# Patient Record
Sex: Female | Born: 1970 | Race: Black or African American | Hispanic: No | Marital: Married | Smoking: Never smoker
Health system: Southern US, Community
[De-identification: ages and names within clinical notes are randomized; demographics above are authoritative.]

## PROBLEM LIST (undated history)

## (undated) DIAGNOSIS — R7303 Prediabetes: Secondary | ICD-10-CM

## (undated) DIAGNOSIS — R0602 Shortness of breath: Secondary | ICD-10-CM

## (undated) DIAGNOSIS — D649 Anemia, unspecified: Secondary | ICD-10-CM

## (undated) DIAGNOSIS — E7800 Pure hypercholesterolemia, unspecified: Secondary | ICD-10-CM

## (undated) DIAGNOSIS — I10 Essential (primary) hypertension: Secondary | ICD-10-CM

## (undated) HISTORY — DX: Shortness of breath: R06.02

## (undated) HISTORY — DX: Prediabetes: R73.03

## (undated) HISTORY — DX: Essential (primary) hypertension: I10

## (undated) HISTORY — DX: Anemia, unspecified: D64.9

## (undated) HISTORY — DX: Pure hypercholesterolemia, unspecified: E78.00

---

## 2008-08-12 ENCOUNTER — Ambulatory Visit: Payer: Self-pay

## 2010-06-22 ENCOUNTER — Ambulatory Visit: Payer: Self-pay

## 2012-05-29 ENCOUNTER — Ambulatory Visit: Payer: Self-pay

## 2012-10-31 HISTORY — PX: REDUCTION MAMMAPLASTY: SUR839

## 2013-07-24 ENCOUNTER — Ambulatory Visit: Payer: Self-pay | Admitting: Family Medicine

## 2014-08-04 ENCOUNTER — Ambulatory Visit: Payer: Self-pay | Admitting: Family Medicine

## 2015-10-12 ENCOUNTER — Other Ambulatory Visit: Payer: Self-pay | Admitting: Family Medicine

## 2015-10-12 DIAGNOSIS — Z1231 Encounter for screening mammogram for malignant neoplasm of breast: Secondary | ICD-10-CM

## 2015-10-21 ENCOUNTER — Ambulatory Visit
Admission: RE | Admit: 2015-10-21 | Discharge: 2015-10-21 | Disposition: A | Discharge status: Home / Self Care | Payer: 59 | Source: Ambulatory Visit | Attending: Family Medicine | Admitting: Family Medicine

## 2015-10-21 DIAGNOSIS — Z1231 Encounter for screening mammogram for malignant neoplasm of breast: Secondary | ICD-10-CM | POA: Diagnosis present

## 2016-06-10 ENCOUNTER — Encounter: Payer: Self-pay | Admitting: Internal Medicine

## 2016-06-10 ENCOUNTER — Encounter (INDEPENDENT_AMBULATORY_CARE_PROVIDER_SITE_OTHER): Payer: Self-pay

## 2016-06-10 ENCOUNTER — Ambulatory Visit: Payer: Self-pay

## 2016-06-10 ENCOUNTER — Inpatient Hospital Stay: Payer: 59 | Attending: Internal Medicine | Admitting: Internal Medicine

## 2016-06-10 DIAGNOSIS — D509 Iron deficiency anemia, unspecified: Secondary | ICD-10-CM | POA: Insufficient documentation

## 2016-06-10 DIAGNOSIS — Z79899 Other long term (current) drug therapy: Secondary | ICD-10-CM | POA: Insufficient documentation

## 2016-06-10 DIAGNOSIS — D500 Iron deficiency anemia secondary to blood loss (chronic): Secondary | ICD-10-CM | POA: Insufficient documentation

## 2016-06-10 DIAGNOSIS — Z975 Presence of (intrauterine) contraceptive device: Secondary | ICD-10-CM | POA: Diagnosis not present

## 2016-06-10 DIAGNOSIS — R5383 Other fatigue: Secondary | ICD-10-CM | POA: Diagnosis not present

## 2016-06-10 DIAGNOSIS — D508 Other iron deficiency anemias: Secondary | ICD-10-CM

## 2016-06-10 DIAGNOSIS — N920 Excessive and frequent menstruation with regular cycle: Secondary | ICD-10-CM | POA: Diagnosis not present

## 2016-06-10 NOTE — Progress Notes (Signed)
[facility] CONSULT NOTE  Patient Care Team: Marisue Ivan, MD as PCP - General (Family Medicine)  CHIEF COMPLAINTS/PURPOSE OF CONSULTATION:    # AUG 2017 IDA- [intol to Iron] Hb-9.2/Ferritin 7 [chronic- PCP office]   No history exists.     HISTORY OF PRESENTING ILLNESS:  Candace Frank 45 y.o.  female with long-standing history of anemia- has been referred was for further evaluation.  Patient complains of significant fatigue; no shortness of breath or chest pain. No swelling in the legs. She denies any blood in stools or black stools. Oral iron causes diarrhea. She does not have significant heavy menstrual periods. She just has 1 day of menstrual periods which is heavy- which is likely from IUD. Never had any prior blood transfusions. No colonoscopy. No rigidity. No weight loss. No family history of GI malignancy.  ROS: A complete 10 point review of system is done which is negative except mentioned above in history of present illness  MEDICAL HISTORY:  Past Medical History:  Diagnosis Date  . Anemia     SURGICAL HISTORY: Past Surgical History:  Procedure Laterality Date  . REDUCTION MAMMAPLASTY      SOCIAL HISTORY: [facility];  No smoking/ no alcohol/ [place]..  Social History   Social History  . Marital status: Married    Spouse name: N/A  . Number of children: N/A  . Years of education: N/A   Occupational History  . Not on file.   Social History Main Topics  . Smoking status: Not on file  . Smokeless tobacco: Not on file  . Alcohol use Not on file  . Drug use: Unknown  . Sexual activity: Not on file   Other Topics Concern  . Not on file   Social History Narrative  . No narrative on file    FAMILY HISTORY: Family History  Problem Relation Age of Onset  . Breast cancer Mother 40    ALLERGIES:  has no allergies on file.  MEDICATIONS:  Current Outpatient Prescriptions  Medication Sig Dispense Refill  . ferrous sulfate 325  (65 FE) MG tablet Take by mouth.    . hydrochlorothiazide (HYDRODIURIL) 12.5 MG tablet Take by mouth.     No current facility-administered medications for this visit.       Marland Kitchen  PHYSICAL EXAMINATION: ECOG PERFORMANCE STATUS: 0 - Asymptomatic  Vitals:   06/10/16 1517  BP: (!) 155/94  Pulse: (!) 109  Resp: 18  Temp: 99 F (37.2 C)   Filed Weights   06/10/16 1517  Weight: 159 lb 2 oz (72.2 kg)    GENERAL: Well-nourished well-developed; Alert, no distress and comfortable.  alone EYES: pallor OROPHARYNX: no thrush or ulceration; good dentition  NECK: supple, no masses felt LYMPH:  no palpable lymphadenopathy in the cervical, axillary or inguinal regions LUNGS: clear to auscultation and  No wheeze or crackles HEART/CVS: regular rate & rhythm and no murmurs; No lower extremity edema ABDOMEN: abdomen soft, non-tender and normal bowel sounds Musculoskeletal:no cyanosis of digits and no clubbing  PSYCH: alert & oriented x 3 with fluent speech NEURO: no focal motor/sensory deficits SKIN:  no rashes or significant lesions  LABORATORY DATA:  I have reviewed the data as listed No results found for: WBC, HGB, HCT, MCV, PLT No results for input(s): NA, K, CL, CO2, GLUCOSE, BUN, CREATININE, CALCIUM, GFRNONAA, GFRAA, PROT, ALBUMIN, AST, ALT, ALKPHOS, BILITOT, BILIDIR, IBILI in the last 8760 hours.  RADIOGRAPHIC STUDIES: I have personally reviewed the radiological images as listed  and agreed with the findings in the report. No results found.  ASSESSMENT & PLAN:   Other iron deficiency anemias Iron deficiency anemia symptomatic ferritin 7/hemoglobin 9.5. The etiology is unclear. Question history of menstrual periods versus others. Recommend urinalysis and stool occult. Discussed the possibile need for endoscopy EGD and colonoscopy.  # Recommend IV Venofer weekly 4  ; start next week. Discussed potential acute reactions.   # Check CBC LDH-2 months.   # Follow with me in 3 months/  CBC and iron studies/ferritin.   Thank you Dr. Burnadette PopLinthavong for allowing me to participate in the care of your pleasant patient. Please do not hesitate to contact me with questions or concerns in the interim.  All questions were answered. The patient knows to call the clinic with any problems, questions or concerns.  # 30 minutes face-to-face with the patient discussing the above plan of care; more than 50% of time spent on counseling and coordination.     Earna CoderGovinda R Tyshawn Keel, MD 06/10/2016 7:00 PM

## 2016-06-10 NOTE — Assessment & Plan Note (Signed)
Iron deficiency anemia symptomatic ferritin 7/hemoglobin 9.5. The etiology is unclear. Question history of menstrual periods versus others. Recommend urinalysis and stool occult. Discussed the possibile need for endoscopy EGD and colonoscopy.  # Recommend IV Venofer weekly 4 ; start next week. Discussed potential acute reactions.   # Check CBC LDH-2 months.   # Follow with me in 3 months/ CBC and iron studies/ferritin.

## 2016-06-10 NOTE — Progress Notes (Signed)
Patient here as new evaluation regarding anemia.  Referred by Dr. Burnadette PopLinthavong.

## 2016-06-17 ENCOUNTER — Inpatient Hospital Stay: Payer: 59

## 2016-06-17 ENCOUNTER — Other Ambulatory Visit: Payer: Self-pay | Admitting: Internal Medicine

## 2016-06-17 VITALS — BP 159/96 | HR 75 | Temp 98.7°F | Resp 18

## 2016-06-17 DIAGNOSIS — D508 Other iron deficiency anemias: Secondary | ICD-10-CM

## 2016-06-17 DIAGNOSIS — D509 Iron deficiency anemia, unspecified: Secondary | ICD-10-CM | POA: Diagnosis not present

## 2016-06-17 MED ORDER — SODIUM CHLORIDE 0.9 % IV SOLN
Freq: Once | INTRAVENOUS | Status: AC
  Administered 2016-06-17: 14:00:00 via INTRAVENOUS
  Filled 2016-06-17: qty 1000

## 2016-06-17 MED ORDER — SODIUM CHLORIDE 0.9 % IV SOLN
200.0000 mg | Freq: Once | INTRAVENOUS | Status: AC
  Administered 2016-06-17: 200 mg via INTRAVENOUS
  Filled 2016-06-17: qty 10

## 2016-06-24 ENCOUNTER — Inpatient Hospital Stay: Payer: 59

## 2016-06-24 VITALS — BP 163/104 | HR 79 | Temp 97.0°F | Resp 18

## 2016-06-24 DIAGNOSIS — D509 Iron deficiency anemia, unspecified: Secondary | ICD-10-CM | POA: Diagnosis not present

## 2016-06-24 DIAGNOSIS — D508 Other iron deficiency anemias: Secondary | ICD-10-CM

## 2016-06-24 MED ORDER — SODIUM CHLORIDE 0.9 % IV SOLN
200.0000 mg | Freq: Once | INTRAVENOUS | Status: AC
  Administered 2016-06-24: 200 mg via INTRAVENOUS
  Filled 2016-06-24: qty 10

## 2016-06-24 MED ORDER — SODIUM CHLORIDE 0.9 % IV SOLN
Freq: Once | INTRAVENOUS | Status: AC
  Administered 2016-06-24: 14:00:00 via INTRAVENOUS
  Filled 2016-06-24: qty 1000

## 2016-06-24 NOTE — Progress Notes (Signed)
Dr. Racheal PatchesBrahmandy came to see patient.  He is aware of high BP.  Patient states that she feels find but did not take her blood pressure medication this morning.  Dr. Racheal PatchesBrahmandy released patient from [facility] advising her to take it when she got home.

## 2016-07-01 ENCOUNTER — Inpatient Hospital Stay: Payer: 59 | Attending: Internal Medicine

## 2016-07-01 VITALS — BP 124/85 | HR 82 | Temp 98.0°F | Resp 18

## 2016-07-01 DIAGNOSIS — D509 Iron deficiency anemia, unspecified: Secondary | ICD-10-CM | POA: Diagnosis not present

## 2016-07-01 DIAGNOSIS — Z975 Presence of (intrauterine) contraceptive device: Secondary | ICD-10-CM | POA: Diagnosis not present

## 2016-07-01 DIAGNOSIS — D508 Other iron deficiency anemias: Secondary | ICD-10-CM

## 2016-07-01 DIAGNOSIS — N920 Excessive and frequent menstruation with regular cycle: Secondary | ICD-10-CM | POA: Diagnosis not present

## 2016-07-01 MED ORDER — SODIUM CHLORIDE 0.9 % IV SOLN
Freq: Once | INTRAVENOUS | Status: AC
  Administered 2016-07-01: 14:00:00 via INTRAVENOUS
  Filled 2016-07-01: qty 1000

## 2016-07-01 MED ORDER — SODIUM CHLORIDE 0.9 % IV SOLN
200.0000 mg | Freq: Once | INTRAVENOUS | Status: AC
  Administered 2016-07-01: 200 mg via INTRAVENOUS
  Filled 2016-07-01: qty 10

## 2016-07-08 ENCOUNTER — Inpatient Hospital Stay: Payer: 59

## 2016-07-08 VITALS — BP 133/85 | HR 96 | Temp 97.0°F | Resp 20

## 2016-07-08 DIAGNOSIS — D508 Other iron deficiency anemias: Secondary | ICD-10-CM

## 2016-07-08 DIAGNOSIS — D509 Iron deficiency anemia, unspecified: Secondary | ICD-10-CM | POA: Diagnosis not present

## 2016-07-08 MED ORDER — SODIUM CHLORIDE 0.9 % IV SOLN
200.0000 mg | Freq: Once | INTRAVENOUS | Status: AC
  Administered 2016-07-08: 200 mg via INTRAVENOUS
  Filled 2016-07-08: qty 10

## 2016-07-08 MED ORDER — SODIUM CHLORIDE 0.9 % IV SOLN
Freq: Once | INTRAVENOUS | Status: AC
  Administered 2016-07-08: 14:00:00 via INTRAVENOUS
  Filled 2016-07-08: qty 1000

## 2016-08-10 ENCOUNTER — Encounter (INDEPENDENT_AMBULATORY_CARE_PROVIDER_SITE_OTHER): Payer: Self-pay

## 2016-08-10 ENCOUNTER — Inpatient Hospital Stay: Payer: 59 | Attending: Internal Medicine

## 2016-08-10 DIAGNOSIS — N920 Excessive and frequent menstruation with regular cycle: Secondary | ICD-10-CM | POA: Diagnosis not present

## 2016-08-10 DIAGNOSIS — D508 Other iron deficiency anemias: Secondary | ICD-10-CM

## 2016-08-10 DIAGNOSIS — Z975 Presence of (intrauterine) contraceptive device: Secondary | ICD-10-CM | POA: Insufficient documentation

## 2016-08-10 DIAGNOSIS — D509 Iron deficiency anemia, unspecified: Secondary | ICD-10-CM | POA: Insufficient documentation

## 2016-08-10 LAB — CBC WITH DIFFERENTIAL/PLATELET
BASOS PCT: 1 %
Basophils Absolute: 0 10*3/uL (ref 0–0.1)
EOS ABS: 0.1 10*3/uL (ref 0–0.7)
Eosinophils Relative: 2 %
HEMATOCRIT: 34.4 % — AB (ref 35.0–47.0)
Hemoglobin: 11.5 g/dL — ABNORMAL LOW (ref 12.0–16.0)
Lymphocytes Relative: 41 %
Lymphs Abs: 1.4 10*3/uL (ref 1.0–3.6)
MCH: 27.1 pg (ref 26.0–34.0)
MCHC: 33.3 g/dL (ref 32.0–36.0)
MCV: 81.5 fL (ref 80.0–100.0)
MONO ABS: 0.3 10*3/uL (ref 0.2–0.9)
MONOS PCT: 10 %
Neutro Abs: 1.6 10*3/uL (ref 1.4–6.5)
Neutrophils Relative %: 46 %
Platelets: 237 10*3/uL (ref 150–440)
RBC: 4.23 MIL/uL (ref 3.80–5.20)
RDW: 24.3 % — AB (ref 11.5–14.5)
WBC: 3.3 10*3/uL — ABNORMAL LOW (ref 3.6–11.0)

## 2016-08-10 LAB — COMPREHENSIVE METABOLIC PANEL
ALBUMIN: 4.2 g/dL (ref 3.5–5.0)
ALT: 14 U/L (ref 14–54)
ANION GAP: 7 (ref 5–15)
AST: 19 U/L (ref 15–41)
Alkaline Phosphatase: 47 U/L (ref 38–126)
BILIRUBIN TOTAL: 0.5 mg/dL (ref 0.3–1.2)
BUN: 12 mg/dL (ref 6–20)
CO2: 25 mmol/L (ref 22–32)
Calcium: 9.1 mg/dL (ref 8.9–10.3)
Chloride: 104 mmol/L (ref 101–111)
Creatinine, Ser: 0.97 mg/dL (ref 0.44–1.00)
GFR calc non Af Amer: >60 mL/min (ref >60)
GLUCOSE: 103 mg/dL — AB (ref 65–99)
POTASSIUM: 4.1 mmol/L (ref 3.5–5.1)
SODIUM: 136 mmol/L (ref 135–145)
Total Protein: 7.2 g/dL (ref 6.5–8.1)

## 2016-08-10 LAB — LACTATE DEHYDROGENASE: LDH: 122 U/L (ref 98–192)

## 2016-09-12 ENCOUNTER — Inpatient Hospital Stay: Payer: 59 | Admitting: Internal Medicine

## 2016-09-12 ENCOUNTER — Inpatient Hospital Stay: Payer: 59

## 2016-09-28 ENCOUNTER — Inpatient Hospital Stay: Payer: 59 | Attending: Internal Medicine | Admitting: Internal Medicine

## 2016-09-28 ENCOUNTER — Inpatient Hospital Stay: Payer: 59

## 2016-09-28 VITALS — BP 143/96 | HR 80 | Temp 97.8°F | Resp 20 | Ht 61.0 in | Wt 155.0 lb

## 2016-09-28 DIAGNOSIS — D508 Other iron deficiency anemias: Secondary | ICD-10-CM

## 2016-09-28 DIAGNOSIS — N920 Excessive and frequent menstruation with regular cycle: Secondary | ICD-10-CM | POA: Diagnosis not present

## 2016-09-28 DIAGNOSIS — Z79899 Other long term (current) drug therapy: Secondary | ICD-10-CM | POA: Diagnosis not present

## 2016-09-28 DIAGNOSIS — D500 Iron deficiency anemia secondary to blood loss (chronic): Secondary | ICD-10-CM | POA: Insufficient documentation

## 2016-09-28 DIAGNOSIS — R231 Pallor: Secondary | ICD-10-CM | POA: Diagnosis not present

## 2016-09-28 LAB — IRON AND TIBC
Iron: 104 ug/dL (ref 28–170)
Saturation Ratios: 24 % (ref 10.4–31.8)
TIBC: 428 ug/dL (ref 250–450)
UIBC: 324 ug/dL

## 2016-09-28 LAB — CBC WITH DIFFERENTIAL/PLATELET
BASOS ABS: 0 10*3/uL (ref 0–0.1)
Basophils Relative: 1 %
Eosinophils Absolute: 0.1 10*3/uL (ref 0–0.7)
Eosinophils Relative: 3 %
HEMATOCRIT: 35.5 % (ref 35.0–47.0)
Hemoglobin: 11.7 g/dL — ABNORMAL LOW (ref 12.0–16.0)
LYMPHS ABS: 1.7 10*3/uL (ref 1.0–3.6)
LYMPHS PCT: 43 %
MCH: 28 pg (ref 26.0–34.0)
MCHC: 33.1 g/dL (ref 32.0–36.0)
MCV: 84.7 fL (ref 80.0–100.0)
MONO ABS: 0.4 10*3/uL (ref 0.2–0.9)
MONOS PCT: 11 %
NEUTROS ABS: 1.6 10*3/uL (ref 1.4–6.5)
Neutrophils Relative %: 42 %
Platelets: 232 10*3/uL (ref 150–440)
RBC: 4.19 MIL/uL (ref 3.80–5.20)
RDW: 17.7 % — AB (ref 11.5–14.5)
WBC: 3.9 10*3/uL (ref 3.6–11.0)

## 2016-09-28 LAB — FERRITIN: Ferritin: 8 ng/mL — ABNORMAL LOW (ref 11–307)

## 2016-09-28 NOTE — Progress Notes (Signed)
[facility] CONSULT NOTE  Patient Care Team: Marisue IvanKanhka Linthavong, MD as PCP - General (Family Medicine)  CHIEF COMPLAINTS/PURPOSE OF CONSULTATION:    # AUG 2017 IDA- [intol to Iron] Hb-9.2/Ferritin 7 [chronic- PCP office] sec to menorrhagia. S/p IV Venofer.    No history exists.     HISTORY OF PRESENTING ILLNESS:  Candace Frank 45 y.o.  female with long-standing history of anemia- Secondary to menorrhagia is here for follow-up. Patient received IV iron/Venofer significant improvement in her energy levels. She unfortunately continues to have heavy menstrual periods. She has not seen a gynecologist; as a gynecologist recently retired. She had not been on oral iron because of diarrhea. Otherwise denies any blood in stools black stools.  ROS: A complete 10 point review of system is done which is negative except mentioned above in history of present illness  MEDICAL HISTORY:  Past Medical History:  Diagnosis Date  . Anemia     SURGICAL HISTORY: Past Surgical History:  Procedure Laterality Date  . REDUCTION MAMMAPLASTY      SOCIAL HISTORY: [facility];  No smoking/ no alcohol/ [place]..  Social History   Social History  . Marital status: Married    Spouse name: N/A  . Number of children: N/A  . Years of education: N/A   Occupational History  . Not on file.   Social History Main Topics  . Smoking status: Not on file  . Smokeless tobacco: Not on file  . Alcohol use Not on file  . Drug use: Unknown  . Sexual activity: Not on file   Other Topics Concern  . Not on file   Social History Narrative  . No narrative on file    FAMILY HISTORY: Family History  Problem Relation Age of Onset  . Breast cancer Mother [id number]    ALLERGIES:  has no allergies on file.  MEDICATIONS:  Current Outpatient Prescriptions  Medication Sig Dispense Refill  . hydrochlorothiazide (HYDRODIURIL) 12.5 MG tablet Take 12.5 mg by mouth daily.     . ferrous sulfate 325 (65 FE)  MG tablet Take by mouth.     No current facility-administered medications for this visit.       Marland Kitchen.  PHYSICAL EXAMINATION: ECOG PERFORMANCE STATUS: 0 - Asymptomatic  Vitals:   09/28/16 1129  BP: (!) 143/96  Pulse: 80  Resp: 20  Temp: 97.8 F (36.6 C)   Filed Weights   09/28/16 1129  Weight: 155 lb (70.3 kg)    GENERAL: Well-nourished well-developed; Alert, no distress and comfortable.  alone EYES: pallor OROPHARYNX: no thrush or ulceration; good dentition  NECK: supple, no masses felt LYMPH:  no palpable lymphadenopathy in the cervical, axillary or inguinal regions LUNGS: clear to auscultation and  No wheeze or crackles HEART/CVS: regular rate & rhythm and no murmurs; No lower extremity edema ABDOMEN: abdomen soft, non-tender and normal bowel sounds Musculoskeletal:no cyanosis of digits and no clubbing  PSYCH: alert & oriented x 3 with fluent speech NEURO: no focal motor/sensory deficits SKIN:  no rashes or significant lesions  LABORATORY DATA:  I have reviewed the data as listed Lab Results  Component Value Date   WBC 3.9 09/28/2016   HGB 11.7 (L) 09/28/2016   HCT 35.5 09/28/2016   MCV 84.7 09/28/2016   PLT 232 09/28/2016    Recent Labs  08/10/16 0822  NA 136  K 4.1  CL 104  CO2 25  GLUCOSE 103*  BUN 12  CREATININE 0.97  CALCIUM 9.1  GFRNONAA >60  GFRAA >60  PROT 7.2  ALBUMIN 4.2  AST 19  ALT 14  ALKPHOS 47  BILITOT 0.5    RADIOGRAPHIC STUDIES: I have personally reviewed the radiological images as listed and agreed with the findings in the report. No results found.  ASSESSMENT & PLAN:   Iron deficiency anemia due to chronic blood loss Iron deficiency anemia symptomatic ferritin 7/hemoglobin 9.5 sec to menorrhagia. S/p IV iron x 4- Hb 11.7 today. Poor tolerance PO iron.   # await number from today; will call pt if she needs it/pt reluctant.   Will try PO iron with orange juice.  # menorrhagia- recommend gyn evaluation.   # recheck labs  in 4 months/ will plan IV iron if needed.      Earna CoderGovinda R Inda Mcglothen, MD 09/28/2016 12:54 PM

## 2016-09-28 NOTE — Patient Instructions (Signed)
Iron-Rich Diet Introduction Iron is a mineral that helps your body to produce hemoglobin. Hemoglobin is a protein in your red blood cells that carries oxygen to your body's tissues. Eating too little iron may cause you to feel weak and tired, and it can increase your risk for infection. Eating enough iron is necessary for your body's metabolism, muscle function, and nervous system. Iron is naturally found in many foods. It can also be added to foods or fortified in foods. There are two types of dietary iron:  Heme iron. Heme iron is absorbed by the body more easily than nonheme iron. Heme iron is found in meat, poultry, and fish.  Nonheme iron. Nonheme iron is found in dietary supplements, iron-fortified grains, beans, and vegetables. You may need to follow an iron-rich diet if:  You have been diagnosed with iron deficiency or iron-deficiency anemia.  You have a condition that prevents you from absorbing dietary iron, such as:  Infection in your intestines.  Celiac disease. This involves long-lasting (chronic) inflammation of your intestines.  You do not eat enough iron.  You eat a diet that is high in foods that impair iron absorption.  You have lost a lot of blood.  You have heavy bleeding during your menstrual cycle.  You are pregnant. What is my plan? Your health care provider may help you to determine how much iron you need per day based on your condition. Generally, when a person consumes sufficient amounts of iron in the diet, the following iron needs are met:  Men.  35-41 years old: 11 mg per day.  73-46 years old: 8 mg per day.  Women.  5-74 years old: 15 mg per day.  102-56 years old: 18 mg per day.  Over 63 years old: 8 mg per day.  Pregnant women: 27 mg per day.  Breastfeeding women: 9 mg per day. What do I need to know about an iron-rich diet?  Eat fresh fruits and vegetables that are high in vitamin C along with foods that are high in iron. This will  help increase the amount of iron that your body absorbs from food, especially with foods containing nonheme iron. Foods that are high in vitamin C include oranges, peppers, tomatoes, and mango.  Take iron supplements only as directed by your health care provider. Overdose of iron can be life-threatening. If you were prescribed iron supplements, take them with orange juice or a vitamin C supplement.  Cook foods in pots and pans that are made from iron.  Eat nonheme iron-containing foods alongside foods that are high in heme iron. This helps to improve your iron absorption.  Certain foods and drinks contain compounds that impair iron absorption. Avoid eating these foods in the same meal as iron-rich foods or with iron supplements. These include:  Coffee, black tea, and red wine.  Milk, dairy products, and foods that are high in calcium.  Beans, soybeans, and peas.  Whole grains.  When eating foods that contain both nonheme iron and compounds that impair iron absorption, follow these tips to absorb iron better.  Soak beans overnight before cooking.  Soak whole grains overnight and drain them before using.  Ferment flours before baking, such as using yeast in bread dough. What foods can I eat? Grains  Iron-fortified breakfast cereal. Iron-fortified whole-wheat bread. Enriched rice. Sprouted grains. Vegetables  Spinach. Potatoes with skin. Green peas. Broccoli. Red and green bell peppers. Fermented vegetables. Fruits  Prunes. Raisins. Oranges. Strawberries. Mango. Grapefruit. Meats and Other Protein  Sources  Beef liver. Oysters. Beef. Shrimp. Turkey. Chicken. Tuna. Sardines. Chickpeas. Nuts. Tofu. Beverages  Tomato juice. Fresh orange juice. Prune juice. Hibiscus tea. Fortified instant breakfast shakes. Condiments  Tahini. Fermented soy sauce. Sweets and Desserts  Black-strap molasses. Other  Wheat germ. The items listed above may not be a complete list of recommended foods or  beverages. Contact your dietitian for more options.  What foods are not recommended? Grains  Whole grains. Bran cereal. Bran flour. Oats. Vegetables  Artichokes. Brussels sprouts. Kale. Fruits  Blueberries. Raspberries. Strawberries. Figs. Meats and Other Protein Sources  Soybeans. Products made from soy protein. Dairy  Milk. Cream. Cheese. Yogurt. Cottage cheese. Beverages  Coffee. Black tea. Red wine. Sweets and Desserts  Cocoa. Chocolate. Ice cream. Other  Basil. Oregano. Parsley. The items listed above may not be a complete list of foods and beverages to avoid. Contact your dietitian for more information.  This information is not intended to replace advice given to you by your health care provider. Make sure you discuss any questions you have with your health care provider. Document Released: 05/31/2005 Document Revised: 05/06/2016 Document Reviewed: 05/14/2014  2017 Elsevier  

## 2016-09-28 NOTE — Assessment & Plan Note (Signed)
Iron deficiency anemia symptomatic ferritin 7/hemoglobin 9.5 sec to menorrhagia. S/p IV iron x 4- Hb 11.7 today. Poor tolerance PO iron.   # await number from today; will call pt if she needs it/pt reluctant.   Will try PO iron with orange juice.  # menorrhagia- recommend gyn evaluation.   # recheck labs in 4 months/ will plan IV iron if needed.

## 2016-09-28 NOTE — Progress Notes (Signed)
Patient still reports heavy menses; however, this has improved since last visit. lmp 09/10/16.

## 2016-09-30 ENCOUNTER — Telehealth: Payer: Self-pay | Admitting: *Deleted

## 2016-09-30 NOTE — Telephone Encounter (Signed)
[facility]ed pt- [facility]  I asked pt to contact our office to discuss her lab results.

## 2016-09-30 NOTE — Telephone Encounter (Signed)
Pt returned my phone call. Discussed results for iron levels with patient. Encouraged otc use of iron and iron enriched diet.  Pt states that she will try these interventions to try and avoid further iv iron infusions.  Teach back process performed.

## 2016-09-30 NOTE — Telephone Encounter (Signed)
-----   Message from Earna CoderGovinda R Brahmanday, MD sent at 09/28/2016 12:57 PM EST ----- Please inform patient that her iron levels are low; but it would be reasonable to give a trial of oral iron pills/iron rich diet- and avoid IV iron now. Follow-up as planned. However if she feels tired/fatigue- recommend to call us for IV iron. Thx

## 2016-11-09 ENCOUNTER — Other Ambulatory Visit: Payer: Self-pay | Admitting: Family Medicine

## 2016-11-09 DIAGNOSIS — Z1231 Encounter for screening mammogram for malignant neoplasm of breast: Secondary | ICD-10-CM

## 2016-12-06 ENCOUNTER — Ambulatory Visit
Admission: RE | Admit: 2016-12-06 | Discharge: 2016-12-06 | Disposition: A | Discharge status: Home / Self Care | Payer: 59 | Source: Ambulatory Visit | Attending: Family Medicine | Admitting: Family Medicine

## 2016-12-06 DIAGNOSIS — Z1231 Encounter for screening mammogram for malignant neoplasm of breast: Secondary | ICD-10-CM | POA: Insufficient documentation

## 2017-01-20 ENCOUNTER — Inpatient Hospital Stay: Payer: 59

## 2017-01-27 ENCOUNTER — Inpatient Hospital Stay: Payer: 59

## 2017-01-27 ENCOUNTER — Inpatient Hospital Stay: Payer: 59 | Admitting: Internal Medicine

## 2017-11-09 ENCOUNTER — Other Ambulatory Visit: Payer: Self-pay | Admitting: Family Medicine

## 2017-11-09 DIAGNOSIS — Z1231 Encounter for screening mammogram for malignant neoplasm of breast: Secondary | ICD-10-CM

## 2017-12-07 ENCOUNTER — Ambulatory Visit
Admission: RE | Admit: 2017-12-07 | Discharge: 2017-12-07 | Disposition: A | Discharge status: Home / Self Care | Payer: Managed Care, Other (non HMO) | Source: Ambulatory Visit | Attending: Family Medicine | Admitting: Family Medicine

## 2017-12-07 DIAGNOSIS — Z1231 Encounter for screening mammogram for malignant neoplasm of breast: Secondary | ICD-10-CM | POA: Insufficient documentation

## 2018-11-09 ENCOUNTER — Other Ambulatory Visit: Payer: Self-pay | Admitting: Family Medicine

## 2018-11-09 DIAGNOSIS — Z1231 Encounter for screening mammogram for malignant neoplasm of breast: Secondary | ICD-10-CM

## 2018-12-04 ENCOUNTER — Other Ambulatory Visit: Payer: Self-pay | Admitting: Family Medicine

## 2018-12-04 DIAGNOSIS — R1032 Left lower quadrant pain: Secondary | ICD-10-CM

## 2018-12-10 ENCOUNTER — Ambulatory Visit
Admission: RE | Admit: 2018-12-10 | Discharge: 2018-12-10 | Disposition: A | Discharge status: Home / Self Care | Payer: Managed Care, Other (non HMO) | Source: Ambulatory Visit | Attending: Family Medicine | Admitting: Family Medicine

## 2018-12-10 ENCOUNTER — Encounter: Payer: Self-pay | Admitting: Radiology

## 2018-12-10 DIAGNOSIS — Z1231 Encounter for screening mammogram for malignant neoplasm of breast: Secondary | ICD-10-CM | POA: Insufficient documentation

## 2018-12-13 ENCOUNTER — Ambulatory Visit
Admission: RE | Admit: 2018-12-13 | Discharge: 2018-12-13 | Disposition: A | Discharge status: Home / Self Care | Payer: Managed Care, Other (non HMO) | Source: Ambulatory Visit | Attending: Family Medicine | Admitting: Family Medicine

## 2018-12-13 DIAGNOSIS — R1032 Left lower quadrant pain: Secondary | ICD-10-CM

## 2018-12-13 LAB — POCT I-STAT CREATININE: Creatinine, Ser: 1 mg/dL (ref 0.44–1.00)

## 2018-12-13 MED ORDER — IOPAMIDOL (ISOVUE-300) INJECTION 61%
100.0000 mL | Freq: Once | INTRAVENOUS | Status: AC | PRN
  Administered 2018-12-13: 100 mL via INTRAVENOUS

## 2019-11-05 ENCOUNTER — Other Ambulatory Visit: Payer: Self-pay | Admitting: Family Medicine

## 2019-11-05 DIAGNOSIS — Z1231 Encounter for screening mammogram for malignant neoplasm of breast: Secondary | ICD-10-CM

## 2019-12-12 ENCOUNTER — Ambulatory Visit
Admission: RE | Admit: 2019-12-12 | Discharge: 2019-12-12 | Disposition: A | Discharge status: Home / Self Care | Payer: Managed Care, Other (non HMO) | Source: Ambulatory Visit | Attending: Family Medicine | Admitting: Family Medicine

## 2019-12-12 DIAGNOSIS — Z1231 Encounter for screening mammogram for malignant neoplasm of breast: Secondary | ICD-10-CM | POA: Insufficient documentation

## 2020-01-20 ENCOUNTER — Ambulatory Visit: Payer: Managed Care, Other (non HMO) | Attending: Internal Medicine

## 2020-01-20 ENCOUNTER — Other Ambulatory Visit: Payer: Self-pay

## 2020-01-20 DIAGNOSIS — Z23 Encounter for immunization: Secondary | ICD-10-CM

## 2020-01-20 NOTE — Progress Notes (Signed)
   Covid-19 Vaccination Clinic  Name:  Candace Frank    MRN: [id number] DOB: 1970/12/16  01/20/2020  [name] was observed post Covid-19 immunization for 15 minutes without incident. She was provided with Vaccine Information Sheet and instruction to access the V-Safe system.   [name] was instructed to call 911 with any severe reactions post vaccine: Marland Kitchen Difficulty breathing  . Swelling of face and throat  . A fast heartbeat  . A bad rash all over body  . Dizziness and weakness   Immunizations Administered    Name Date Dose VIS Date Route   Pfizer COVID-19 Vaccine 01/20/2020 12:27 PM 0.3 mL 10/11/2019 Intramuscular   Manufacturer: [employer], [employer]   Lot: [id number]   NDC: [address]-5

## 2020-02-12 ENCOUNTER — Ambulatory Visit: Payer: Managed Care, Other (non HMO) | Attending: Internal Medicine

## 2020-02-12 DIAGNOSIS — Z23 Encounter for immunization: Secondary | ICD-10-CM

## 2020-02-12 NOTE — Progress Notes (Signed)
   Covid-19 Vaccination Clinic  Name:  Candace Frank    MRN: [id number] DOB: May 22, 1971  02/12/2020  [name] was observed post Covid-19 immunization for 15 minutes without incident. She was provided with Vaccine Information Sheet and instruction to access the V-Safe system.   [name] was instructed to call 911 with any severe reactions post vaccine: Marland Kitchen Difficulty breathing  . Swelling of face and throat  . A fast heartbeat  . A bad rash all over body  . Dizziness and weakness   Immunizations Administered    Name Date Dose VIS Date Route   Pfizer COVID-19 Vaccine 02/12/2020  1:23 PM 0.3 mL 10/11/2019 Intramuscular   Manufacturer: [employer], [employer]   Lot: [id number]   NDC: [address]-8

## 2020-12-02 ENCOUNTER — Other Ambulatory Visit: Payer: Self-pay | Admitting: Family Medicine

## 2020-12-02 DIAGNOSIS — Z1231 Encounter for screening mammogram for malignant neoplasm of breast: Secondary | ICD-10-CM

## 2020-12-15 ENCOUNTER — Ambulatory Visit
Admission: RE | Admit: 2020-12-15 | Discharge: 2020-12-15 | Disposition: A | Discharge status: Home / Self Care | Payer: Managed Care, Other (non HMO) | Source: Ambulatory Visit | Attending: Family Medicine | Admitting: Family Medicine

## 2020-12-15 ENCOUNTER — Other Ambulatory Visit: Payer: Self-pay

## 2020-12-15 DIAGNOSIS — Z1231 Encounter for screening mammogram for malignant neoplasm of breast: Secondary | ICD-10-CM | POA: Insufficient documentation

## 2020-12-29 ENCOUNTER — Other Ambulatory Visit: Payer: Self-pay | Admitting: Obstetrics and Gynecology

## 2020-12-29 DIAGNOSIS — Z1231 Encounter for screening mammogram for malignant neoplasm of breast: Secondary | ICD-10-CM

## 2022-02-02 ENCOUNTER — Other Ambulatory Visit: Payer: Self-pay | Admitting: Family Medicine

## 2022-02-02 DIAGNOSIS — Z1231 Encounter for screening mammogram for malignant neoplasm of breast: Secondary | ICD-10-CM

## 2022-04-05 ENCOUNTER — Ambulatory Visit
Admission: RE | Admit: 2022-04-05 | Discharge: 2022-04-05 | Disposition: A | Discharge status: Home / Self Care | Payer: Managed Care, Other (non HMO) | Source: Ambulatory Visit | Attending: Family Medicine | Admitting: Family Medicine

## 2022-04-05 DIAGNOSIS — Z1231 Encounter for screening mammogram for malignant neoplasm of breast: Secondary | ICD-10-CM | POA: Insufficient documentation

## 2022-12-13 ENCOUNTER — Other Ambulatory Visit: Payer: Self-pay | Admitting: Obstetrics and Gynecology

## 2022-12-13 DIAGNOSIS — Z1231 Encounter for screening mammogram for malignant neoplasm of breast: Secondary | ICD-10-CM

## 2023-04-14 ENCOUNTER — Ambulatory Visit
Admission: RE | Admit: 2023-04-14 | Discharge: 2023-04-14 | Disposition: A | Discharge status: Home / Self Care | Payer: Managed Care, Other (non HMO) | Source: Ambulatory Visit | Attending: Obstetrics and Gynecology | Admitting: Obstetrics and Gynecology

## 2023-04-14 DIAGNOSIS — Z1231 Encounter for screening mammogram for malignant neoplasm of breast: Secondary | ICD-10-CM | POA: Diagnosis present

## 2023-09-18 IMAGING — MG MM DIGITAL SCREENING BILAT W/ TOMO AND CAD
8 series · 8 of 24 positions shown · non-contrast
Comparison: Previous exam(s).

CLINICAL DATA: Screening.

EXAM:
DIGITAL SCREENING BILATERAL MAMMOGRAM WITH TOMOSYNTHESIS AND CAD
TECHNIQUE: Bilateral screening digital craniocaudal and mediolateral oblique
mammograms were obtained. Bilateral screening digital breast
tomosynthesis was performed. The images were evaluated with
computer-aided detection.

[R MLO synth-2D]
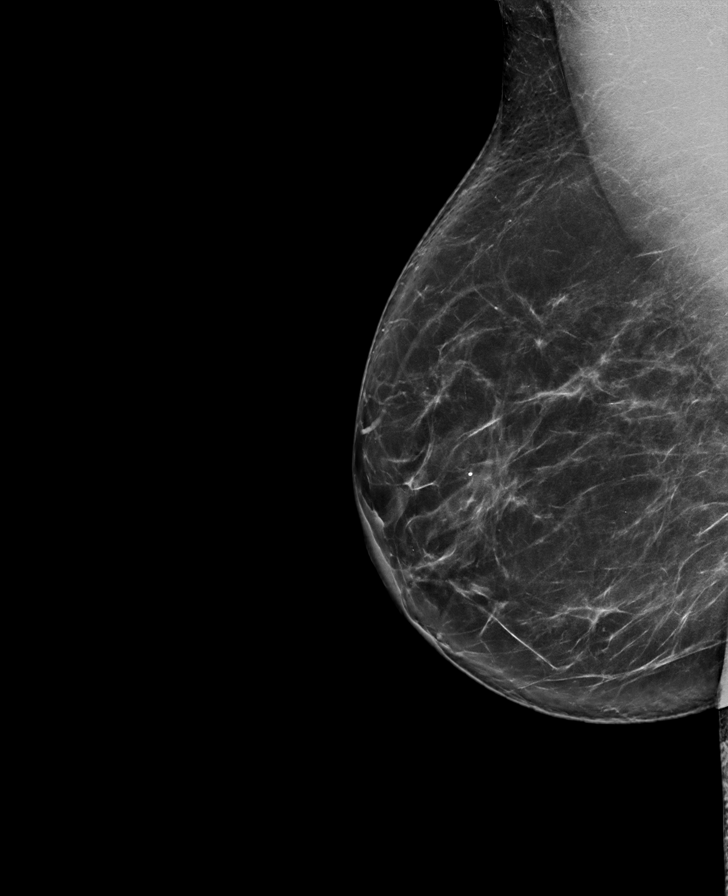

[L MLO synth-2D]
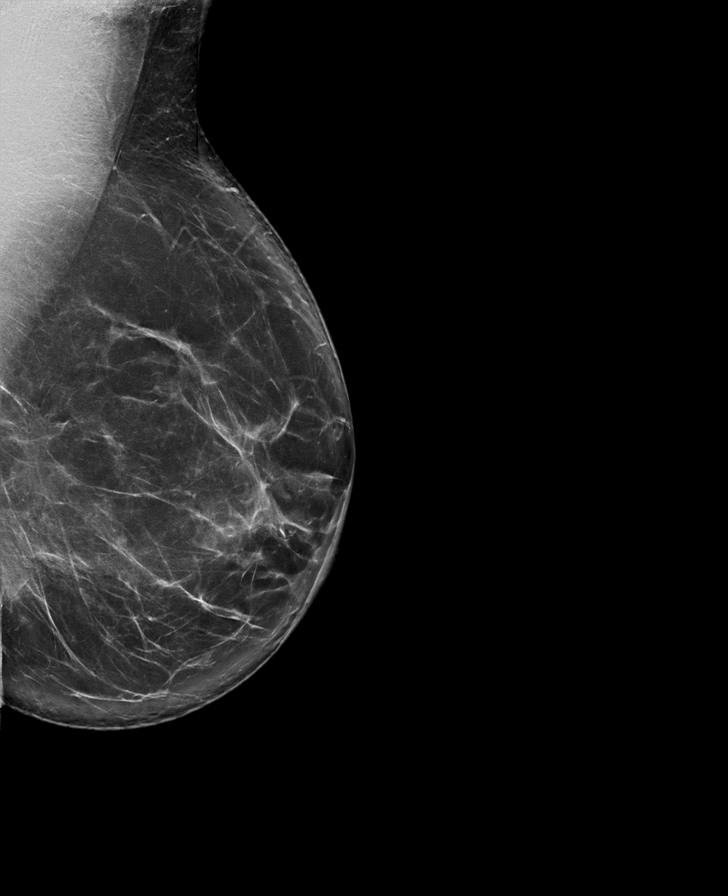

[R CC synth-2D]
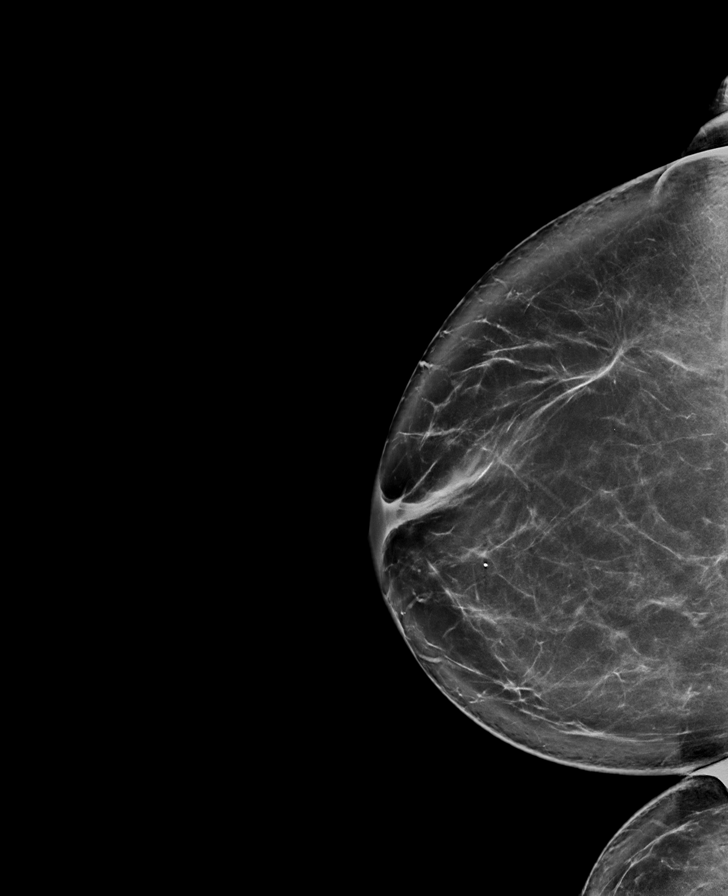

[L CC synth-2D]
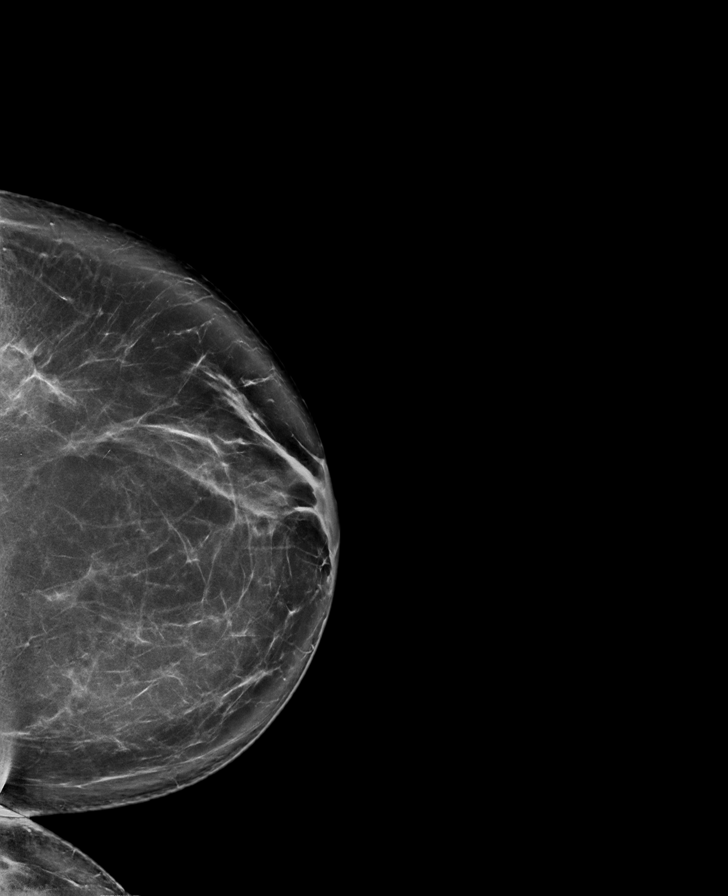

[L MLO tomo · tomo slice 51/102.0]
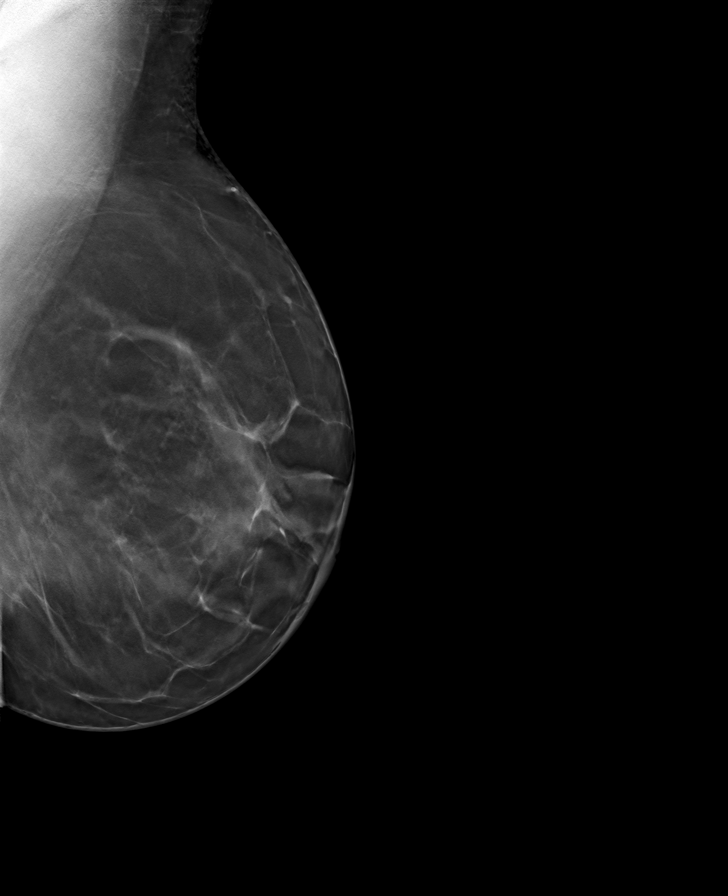

[L CC tomo · tomo slice 51/101.0]
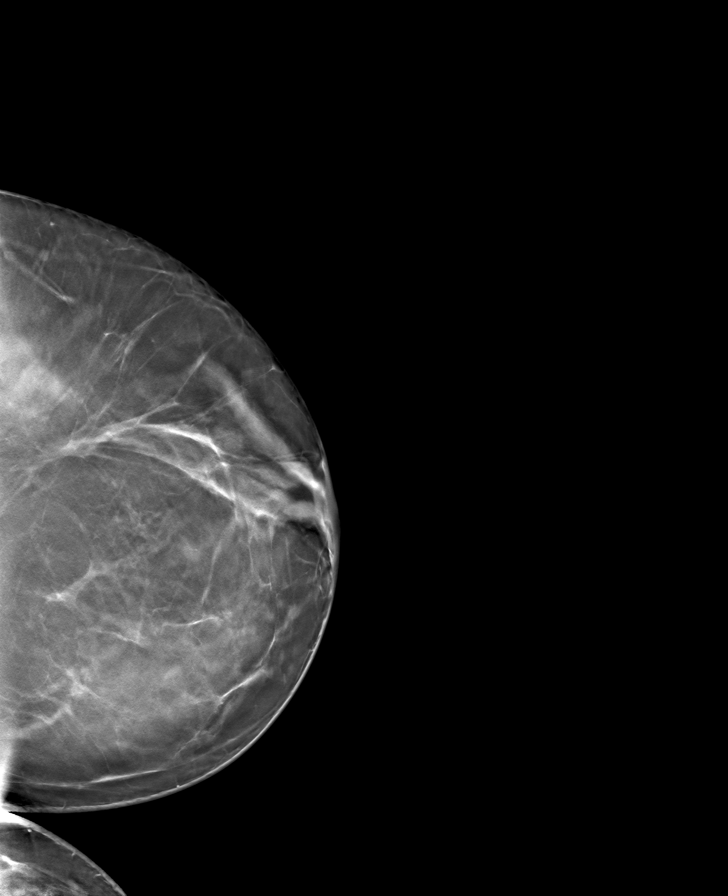

[R CC tomo · tomo slice 51/102.0]
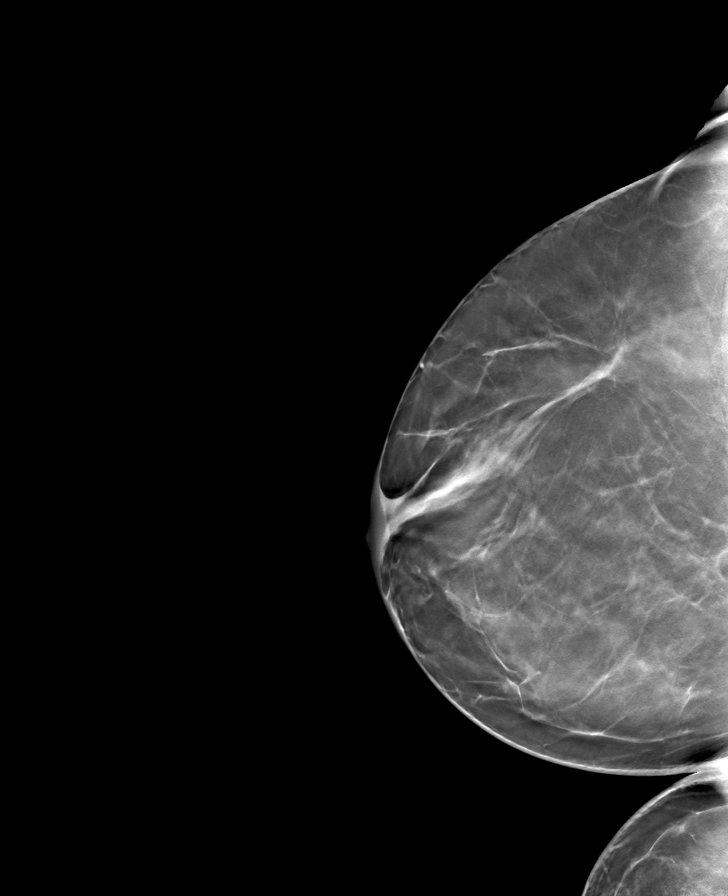

[R MLO tomo · tomo slice 48/95.0]
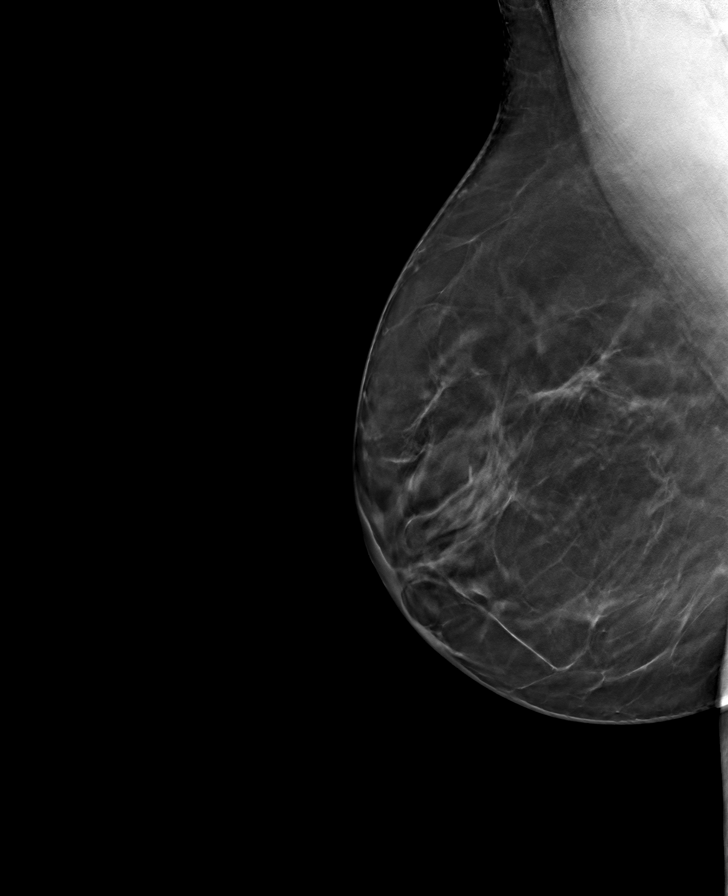

[8 of 24 positions shown; findings below may reference images not displayed]

ACR Breast Density Category b: There are scattered areas of
fibroglandular density.
FINDINGS: There are no findings suspicious for malignancy.
IMPRESSION: No mammographic evidence of malignancy. A result letter of this
screening mammogram will be mailed directly to the patient.

RECOMMENDATION:
Screening mammogram in one year. (Code:51-O-LD2)

BI-RADS CATEGORY  1: Negative.

## 2024-04-01 ENCOUNTER — Other Ambulatory Visit: Payer: Self-pay | Admitting: Obstetrics and Gynecology

## 2024-04-01 DIAGNOSIS — Z1231 Encounter for screening mammogram for malignant neoplasm of breast: Secondary | ICD-10-CM

## 2024-06-06 ENCOUNTER — Ambulatory Visit
Admission: RE | Admit: 2024-06-06 | Discharge: 2024-06-06 | Disposition: A | Discharge status: Home / Self Care | Source: Ambulatory Visit | Attending: Obstetrics and Gynecology | Admitting: Obstetrics and Gynecology

## 2024-06-06 DIAGNOSIS — Z1231 Encounter for screening mammogram for malignant neoplasm of breast: Secondary | ICD-10-CM | POA: Insufficient documentation

## 2024-07-11 ENCOUNTER — Ambulatory Visit (INDEPENDENT_AMBULATORY_CARE_PROVIDER_SITE_OTHER): Admitting: Family Medicine

## 2024-07-11 ENCOUNTER — Encounter (INDEPENDENT_AMBULATORY_CARE_PROVIDER_SITE_OTHER): Payer: Self-pay | Admitting: Family Medicine

## 2024-07-11 VITALS — BP 137/86 | HR 92 | Temp 98.1°F | Ht 61.0 in | Wt 171.0 lb

## 2024-07-11 DIAGNOSIS — Z0289 Encounter for other administrative examinations: Secondary | ICD-10-CM

## 2024-07-11 DIAGNOSIS — E66811 Obesity, class 1: Secondary | ICD-10-CM

## 2024-07-11 DIAGNOSIS — E669 Obesity, unspecified: Secondary | ICD-10-CM | POA: Diagnosis not present

## 2024-07-11 DIAGNOSIS — Z6832 Body mass index (BMI) 32.0-32.9, adult: Secondary | ICD-10-CM

## 2024-07-11 DIAGNOSIS — R7303 Prediabetes: Secondary | ICD-10-CM | POA: Diagnosis not present

## 2024-07-11 DIAGNOSIS — E785 Hyperlipidemia, unspecified: Secondary | ICD-10-CM

## 2024-07-11 DIAGNOSIS — I10 Essential (primary) hypertension: Secondary | ICD-10-CM

## 2024-07-11 NOTE — Progress Notes (Signed)
 Office: [phone]  /  Fax: [phone]  WEIGHT SUMMARY AND BIOMETRICS  Anthropometric Measurements Height: 5' 1 (1.549 m) Weight: 171 lb (77.6 kg) BMI (Calculated): 32.33 Peak Weight: 171 lb   Body Composition  Body Fat %: 35.4 % Fat Mass (lbs): 60.6 lbs Muscle Mass (lbs): 105 lbs Total Body Water (lbs): 68.8 lbs Visceral Fat Rating : 9   Other Clinical Data Fasting: no Labs: no Today's Visit #: Info Session Comments: Info Session    Chief Complaint: OBESITY   History of Present Illness Candace Frank is a 53 year old female who presents for a consultation regarding obesity management. She is accompanied by her husband, Sergio. She was referred by a coworker for evaluation of her obesity.  She has experienced weight gain since the COVID-19 pandemic, approximately five years ago. Her weight peaked at 171 pounds and remains the same, with a BMI of 32.3. She has attempted to manage her weight through walking and weekly consultations with a nutritionist but finds it challenging to prepare meals as advised due to time constraints. Her weight issues began after getting a Mirena IUD, with increased appetite occurring about six to eight months post-insertion.  She has a history of hypertension and is currently on lisinopril hydrochlorothiazide. She was diagnosed as prediabetic last year. She finds it difficult to consistently monitor her carbohydrate intake and follow a Mediterranean diet.  Her social history includes living with her husband and her 49 year old son. 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 She wants more energy and believes weight loss would encourage her to walk more, as she enjoys walking for mental health benefits but feels discouraged by the lack of visible results.  No weight issues during her younger years. Increased appetite following Mirena IUD insertion.      PHYSICAL EXAM:  Blood pressure 137/86,  pulse 92, temperature 98.1 F (36.7 C), height 5' 1 (1.549 m), weight 171 lb (77.6 kg), SpO2 97%. Body mass index is 32.31 kg/m.  DIAGNOSTIC DATA REVIEWED:  BMET    Component Value Date/Time   NA 136 08/10/2016 0822   K 4.1 08/10/2016 0822   CL 104 08/10/2016 0822   CO2 25 08/10/2016 0822   GLUCOSE 103 (H) 08/10/2016 0822   BUN 12 08/10/2016 0822   CREATININE 1.00 12/13/2018 1006   CALCIUM 9.1 08/10/2016 0822   GFRNONAA >60 08/10/2016 0822   GFRAA >60 08/10/2016 0822   No results found for: HGBA1C No results found for: INSULIN No results found for: TSH CBC    Component Value Date/Time   WBC 3.9 09/28/2016 1101   RBC 4.19 09/28/2016 1101   HGB 11.7 (L) 09/28/2016 1101   HCT 35.5 09/28/2016 1101   PLT 232 09/28/2016 1101   MCV 84.7 09/28/2016 1101   MCH 28.0 09/28/2016 1101   MCHC 33.1 09/28/2016 1101   RDW 17.7 (H) 09/28/2016 1101   Iron  Studies    Component Value Date/Time   IRON  104 09/28/2016 1101   TIBC 428 09/28/2016 1101   FERRITIN 8 (L) 09/28/2016 1101   IRONPCTSAT 24 09/28/2016 1101   Lipid Panel  No results found for: CHOL, TRIG, HDL, CHOLHDL, VLDL, LDLCALC, LDLDIRECT Hepatic Function Panel     Component Value Date/Time   PROT 7.2 08/10/2016 0822   ALBUMIN 4.2 08/10/2016 0822   AST 19 08/10/2016 0822   ALT 14 08/10/2016 0822   ALKPHOS 47 08/10/2016 0822   BILITOT 0.5 08/10/2016 9177  No results found for: TSH Nutritional No results found for: VD25OH   Assessment and Plan Assessment & Plan Obesity Obesity with a BMI of 32.3, visceral fat rating of 9, and body fat percentage of 35.4. Weight struggles began approximately five years ago, coinciding with the COVID pandemic. Potential contributing factors include time constraints and the use of a Mirena IUD. Genetic and metabolic factors contribute to obesity, with the body having mechanisms to resist weight loss. - Schedule a comprehensive workup visit including blood  work, EKG, and metabolism test. - Provide new patient paperwork to gather detailed information on eating habits and preferences. - Develop an initial semi-customized eating plan to address major factors contributing to weight retention. - Schedule follow-up visits every two weeks initially, then monthly, and eventually every three to four months for maintenance. - Discuss the potential role of medications in aiding weight loss.  Hypertension Hypertension currently managed with lisinopril hydrochlorothiazide. Blood pressure reading today is 137/86 mmHg. - Address hypertension in conjunction with weight management strategies.  Prediabetes Prediabetes diagnosed approximately one year ago. Advised to monitor carbohydrate intake and follow a Mediterranean diet. - Include prediabetes management in the comprehensive weight management plan.  Hyperlipidemia Hyperlipidemia with plans to address cholesterol levels as part of the overall health management strategy. - Include hyperlipidemia management in the comprehensive weight management plan. - Monitor cholesterol levels regularly.     I personally spent a total of 35 minutes in the care of the patient today including preparing to see the patient, documenting clinical information in the EMR, and explaining the pathophysiology of obesity and how it is significantly more complex than eat less and exercise more.    Lauris was informed of the importance of frequent follow up visits to maximize her success with intensive lifestyle modifications for her obesity and obesity related health conditions as recommended by USPSTF and CMS guidelines   Louann Penton, MD

## 2024-08-15 ENCOUNTER — Ambulatory Visit (INDEPENDENT_AMBULATORY_CARE_PROVIDER_SITE_OTHER): Admitting: Family Medicine

## 2024-08-15 ENCOUNTER — Encounter (INDEPENDENT_AMBULATORY_CARE_PROVIDER_SITE_OTHER): Payer: Self-pay | Admitting: Family Medicine

## 2024-08-15 VITALS — BP 137/88 | HR 81 | Temp 97.7°F | Ht 61.0 in | Wt 170.0 lb

## 2024-08-15 DIAGNOSIS — R0609 Other forms of dyspnea: Secondary | ICD-10-CM | POA: Diagnosis not present

## 2024-08-15 DIAGNOSIS — R0602 Shortness of breath: Secondary | ICD-10-CM

## 2024-08-15 DIAGNOSIS — E669 Obesity, unspecified: Secondary | ICD-10-CM

## 2024-08-15 DIAGNOSIS — Z6832 Body mass index (BMI) 32.0-32.9, adult: Secondary | ICD-10-CM

## 2024-08-15 DIAGNOSIS — E785 Hyperlipidemia, unspecified: Secondary | ICD-10-CM | POA: Diagnosis not present

## 2024-08-15 DIAGNOSIS — E611 Iron deficiency: Secondary | ICD-10-CM

## 2024-08-15 DIAGNOSIS — I10 Essential (primary) hypertension: Secondary | ICD-10-CM

## 2024-08-15 DIAGNOSIS — E119 Type 2 diabetes mellitus without complications: Secondary | ICD-10-CM

## 2024-08-15 DIAGNOSIS — R5383 Other fatigue: Secondary | ICD-10-CM

## 2024-08-15 DIAGNOSIS — R7303 Prediabetes: Secondary | ICD-10-CM

## 2024-08-15 DIAGNOSIS — Z862 Personal history of diseases of the blood and blood-forming organs and certain disorders involving the immune mechanism: Secondary | ICD-10-CM

## 2024-08-15 DIAGNOSIS — Z1331 Encounter for screening for depression: Secondary | ICD-10-CM

## 2024-08-15 NOTE — Progress Notes (Signed)
 Office: [phone]  /  Fax: [phone]  WEIGHT SUMMARY AND BIOMETRICS  Anthropometric Measurements Height: 5' 1 (1.549 m) Weight: 170 lb (77.1 kg) BMI (Calculated): 32.14 Starting Weight: 170 lb Peak Weight: 173 lb   Body Composition  Body Fat %: 34.4 % Fat Mass (lbs): 58.6 lbs Muscle Mass (lbs): 106.4 lbs Total Body Water (lbs): 71.2 lbs Visceral Fat Rating : 8   Other Clinical Data RMR: 1382 Fasting: yes Labs: yes Today's Visit #: 1 Starting Date: 08/15/24    Chief Complaint: OBESITY    History of Present Illness Candace Frank is a 53 year old female with obesity and hypertension who presents for a workup to determine ideal treatment options.  She is motivated to work on diet, exercise, and weight loss. She has considered weight loss surgery but has not pursued it, opting instead to try lifestyle modifications first. She has previously worked with a nutritionist and is open to dietary changes, including potentially adopting a vegetarian diet, although she is currently aligning her eating plan with her husband's for convenience.  She has a history of hypertension and is currently taking Zestoretic. Her blood pressure was initially recorded at 145/71 mmHg and improved to 137/88 mmHg upon repeat measurement. She is committed to lifestyle changes to help manage her blood pressure.  She also has hyperlipidemia and is on lovastatin 40 mg. She is hopeful that lifestyle modifications will allow her to reduce or discontinue her cholesterol medication.  She states has prediabetes and is not on medication for this condition. She is focused on lifestyle changes. Her last HgA1c at [facility] in August of this year is 6.8 suggesting diabetes which is not in ideal control.  She experiences fatigue, which may or may not be related to her weight. She sometimes skips meals due to time constraints or indecision about what to eat. She has a history of iron  deficiency with  associated fatigue.  She experiences dyspnea on exertion, which may be due to exercise intolerance. No history of asthma or other breathing concerns.  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 She is now able to prioritize her health and dietary needs. She has expressed interest in becoming vegetarian for health benefits. She dislikes strawberries, coconut, and most seafood, but will eat shrimp.  Testing today includes:  Basal Metabolic Rate calculated via Bioimpedance scale 1515 Resting Energy Expenditure measured via Indirect Calorimeter 1382 and is lower  than expected. PHQ-9 score was WNL at 7 Epworth Sleepiness Score was elevated at 17       PHYSICAL EXAM:  Blood pressure 137/88, pulse 81, temperature 97.7 F (36.5 C), height 5' 1 (1.549 m), weight 170 lb (77.1 kg), SpO2 96%. Body mass index is 32.12 kg/m.  DIAGNOSTIC DATA REVIEWED:  BMET    Component Value Date/Time   NA 136 08/10/2016 0822   K 4.1 08/10/2016 0822   CL 104 08/10/2016 0822   CO2 25 08/10/2016 0822   GLUCOSE 103 (H) 08/10/2016 0822   BUN 12 08/10/2016 0822   CREATININE 1.00 12/13/2018 1006   CALCIUM 9.1 08/10/2016 0822   GFRNONAA >60 08/10/2016 0822   GFRAA >60 08/10/2016 0822   No results found for: HGBA1C No results found for: INSULIN No results found for: TSH CBC    Component Value Date/Time   WBC 3.9 09/28/2016 1101   RBC 4.19 09/28/2016 1101   HGB 11.7 (L) 09/28/2016 1101  HCT 35.5 09/28/2016 1101   PLT 232 09/28/2016 1101   MCV 84.7 09/28/2016 1101   MCH 28.0 09/28/2016 1101   MCHC 33.1 09/28/2016 1101   RDW 17.7 (H) 09/28/2016 1101   Iron  Studies    Component Value Date/Time   IRON  104 09/28/2016 1101   TIBC 428 09/28/2016 1101   FERRITIN 8 (L) 09/28/2016 1101   IRONPCTSAT 24 09/28/2016 1101   Lipid Panel  No results found for: CHOL, TRIG, HDL, CHOLHDL, VLDL, LDLCALC,  LDLDIRECT Hepatic Function Panel     Component Value Date/Time   PROT 7.2 08/10/2016 0822   ALBUMIN 4.2 08/10/2016 0822   AST 19 08/10/2016 0822   ALT 14 08/10/2016 0822   ALKPHOS 47 08/10/2016 0822   BILITOT 0.5 08/10/2016 0822   No results found for: TSH Nutritional No results found for: VD25OH   Assessment and Plan Assessment & Plan Obesity Obesity with emphasis on lifestyle modifications for weight loss. Discussed body's resistance mechanisms, including decreased metabolism and increased hunger signals. Explored weight loss surgery but noted most do not require it. Emphasized structured eating plan and potential benefits of a vegetarian diet, though not suitable for everyone. Discussed limitations of procedures like Sonabella, targeting subcutaneous fat, not for significant weight loss. - Initiate category two eating plan with structured guidelines. - Order blood work to assess current health status. - Schedule follow-up in two weeks to review test results and adjust the plan as needed. - Advise against weighing herself between visits to focus on lifestyle changes rather than weight. - Discuss potential referral to bariatric psychologist if needed for emotional eating behaviors.  Hypertension Hypertension managed with Zestoretic. Initial blood pressure was 145/71, improved to 137/88. Lifestyle modifications planned to further manage blood pressure. - Continue Zestoretic for hypertension management. - Implement lifestyle modifications including diet and exercise.  Type 2 diabetes mellitus Type 2 diabetes mellitus with recent hemoglobin A1c of 6.8, indicating suboptimal control. Managed with diet control and no medications. Emphasis on lifestyle modifications to improve glycemic control. - Focus on diet control and lifestyle modifications to manage diabetes. - Review lab results in two weeks to assess progress.  Hyperlipidemia Hyperlipidemia managed with lovastatin 40 mg.  She is motivated to work on diet and exercise to potentially reduce or discontinue medication. - Continue lovastatin 40 mg for hyperlipidemia. - Implement lifestyle modifications to improve cholesterol levels.  Fatigue Fatigue of uncertain etiology. Possible relation to weight, but independent workup planned to identify contributing factors. - Order CBC and anemia panel to evaluate for iron  deficiency and other causes of fatigue.  Dyspnea on exertion Dyspnea on exertion possibly related to exercise intolerance. Plan to monitor and reassess after lifestyle modifications. - Implement lifestyle modifications to improve exercise tolerance. - Reassess dyspnea after lifestyle changes; consider further workup if no improvement.  History of iron  deficiency with fatigue Iron  deficiency with associated fatigue. Plan to evaluate current iron  status as part of fatigue workup. - Include iron  studies in the anemia panel to assess current iron  status.  Follow-Up Follow-up plan to review test results and adjust treatment as needed. - Schedule follow-up appointment in two weeks to review test results and discuss progress.     I personally spent a total of 40 minutes in the care of the patient today including preparing to see the patient, reviewing separately obtained history, performing a medically appropriate evaluation of current problems, placing orders in the EMR, documenting clinical information in the EMR, customized nutritional counseling for their specific health and  social needs, independently interpreting results, and explaining the pathophysiology of obesity and how it is significantly more complex than eat less and exercise more.    Adele was informed of the importance of frequent follow up visits to maximize her success with intensive lifestyle modifications for her obesity and obesity related health conditions as recommended by USPSTF and CMS guidelines   Louann Penton, MD

## 2024-08-16 LAB — CBC WITH DIFFERENTIAL/PLATELET
Basophils Absolute: 0 x10E3/uL (ref 0.0–0.2)
Basos: 1 %
EOS (ABSOLUTE): 0.1 x10E3/uL (ref 0.0–0.4)
Eos: 3 %
Hematocrit: 39.8 % (ref 34.0–46.6)
Hemoglobin: 12.9 g/dL (ref 11.1–15.9)
Immature Grans (Abs): 0 x10E3/uL (ref 0.0–0.1)
Immature Granulocytes: 0 %
Lymphocytes Absolute: 1.5 x10E3/uL (ref 0.7–3.1)
Lymphs: 43 %
MCH: 30.2 pg (ref 26.6–33.0)
MCHC: 32.4 g/dL (ref 31.5–35.7)
MCV: 93 fL (ref 79–97)
Monocytes Absolute: 0.3 x10E3/uL (ref 0.1–0.9)
Monocytes: 8 %
Neutrophils Absolute: 1.6 x10E3/uL (ref 1.4–7.0)
Neutrophils: 45 %
Platelets: 276 x10E3/uL (ref 150–450)
RBC: 4.27 x10E6/uL (ref 3.77–5.28)
RDW: 14 % (ref 11.7–15.4)
WBC: 3.5 x10E3/uL (ref 3.4–10.8)

## 2024-08-16 LAB — IRON AND TIBC
Iron Saturation: 31 % (ref 15–55)
Iron: 90 ug/dL (ref 27–159)
Total Iron Binding Capacity: 295 ug/dL (ref 250–450)
UIBC: 205 ug/dL (ref 131–425)

## 2024-08-16 LAB — CMP14+EGFR
ALT: 41 IU/L — ABNORMAL HIGH (ref 0–32)
AST: 30 IU/L (ref 0–40)
Albumin: 4.5 g/dL (ref 3.8–4.9)
Alkaline Phosphatase: 67 IU/L (ref 49–135)
BUN/Creatinine Ratio: 14 (ref 9–23)
BUN: 15 mg/dL (ref 6–24)
Bilirubin Total: 0.4 mg/dL (ref 0.0–1.2)
CO2: 23 mmol/L (ref 20–29)
Calcium: 9.5 mg/dL (ref 8.7–10.2)
Chloride: 102 mmol/L (ref 96–106)
Creatinine, Ser: 1.05 mg/dL — ABNORMAL HIGH (ref 0.57–1.00)
Globulin, Total: 2.5 g/dL (ref 1.5–4.5)
Glucose: 94 mg/dL (ref 70–99)
Potassium: 4.5 mmol/L (ref 3.5–5.2)
Sodium: 139 mmol/L (ref 134–144)
Total Protein: 7 g/dL (ref 6.0–8.5)
eGFR: 64 mL/min/1.73 (ref >59)

## 2024-08-16 LAB — MAGNESIUM: Magnesium: 2.3 mg/dL (ref 1.6–2.3)

## 2024-08-16 LAB — LIPID PANEL+APOB
Apolipoprotein B: 118 mg/dL — ABNORMAL HIGH (ref <90)
Cholesterol, Total: 230 mg/dL — ABNORMAL HIGH (ref 100–199)
HDL-C: 42 mg/dL (ref >39)
LDL-C (NIH Calc): 157 mg/dL — ABNORMAL HIGH (ref 0–99)
Non-HDL Cholesterol: 188 mg/dL — ABNORMAL HIGH (ref 0–129)
Triglycerides: 169 mg/dL — ABNORMAL HIGH (ref 0–149)

## 2024-08-16 LAB — INSULIN, RANDOM: INSULIN: 28 u[IU]/mL — ABNORMAL HIGH (ref 2.6–24.9)

## 2024-08-16 LAB — HEMOGLOBIN A1C
Est. average glucose Bld gHb Est-mCnc: 140 mg/dL
Hgb A1c MFr Bld: 6.5 % — ABNORMAL HIGH (ref 4.8–5.6)

## 2024-08-16 LAB — VITAMIN B12: Vitamin B-12: 289 pg/mL (ref 232–1245)

## 2024-08-16 LAB — TSH: TSH: 0.969 u[IU]/mL (ref 0.450–4.500)

## 2024-08-16 LAB — FOLATE: Folate: 9.4 ng/mL (ref >3.0)

## 2024-08-16 LAB — PHOSPHORUS: Phosphorus: 2.9 mg/dL — ABNORMAL LOW (ref 3.0–4.3)

## 2024-08-16 LAB — FERRITIN: Ferritin: 71 ng/mL (ref 15–150)

## 2024-08-16 LAB — VITAMIN D 25 HYDROXY (VIT D DEFICIENCY, FRACTURES): Vit D, 25-Hydroxy: 13.6 ng/mL — ABNORMAL LOW (ref 30.0–100.0)

## 2024-08-29 ENCOUNTER — Encounter (INDEPENDENT_AMBULATORY_CARE_PROVIDER_SITE_OTHER): Payer: Self-pay | Admitting: Family Medicine

## 2024-08-29 ENCOUNTER — Ambulatory Visit (INDEPENDENT_AMBULATORY_CARE_PROVIDER_SITE_OTHER): Admitting: Family Medicine

## 2024-08-29 VITALS — BP 101/70 | HR 86 | Temp 98.0°F | Ht 61.0 in | Wt 163.0 lb

## 2024-08-29 DIAGNOSIS — E119 Type 2 diabetes mellitus without complications: Secondary | ICD-10-CM | POA: Insufficient documentation

## 2024-08-29 DIAGNOSIS — E782 Mixed hyperlipidemia: Secondary | ICD-10-CM | POA: Insufficient documentation

## 2024-08-29 DIAGNOSIS — E559 Vitamin D deficiency, unspecified: Secondary | ICD-10-CM | POA: Diagnosis not present

## 2024-08-29 DIAGNOSIS — E1169 Type 2 diabetes mellitus with other specified complication: Secondary | ICD-10-CM

## 2024-08-29 DIAGNOSIS — I10 Essential (primary) hypertension: Secondary | ICD-10-CM | POA: Diagnosis not present

## 2024-08-29 DIAGNOSIS — R7401 Elevation of levels of liver transaminase levels: Secondary | ICD-10-CM

## 2024-08-29 DIAGNOSIS — E538 Deficiency of other specified B group vitamins: Secondary | ICD-10-CM

## 2024-08-29 DIAGNOSIS — R7989 Other specified abnormal findings of blood chemistry: Secondary | ICD-10-CM | POA: Insufficient documentation

## 2024-08-29 DIAGNOSIS — E669 Obesity, unspecified: Secondary | ICD-10-CM | POA: Insufficient documentation

## 2024-08-29 DIAGNOSIS — Z6830 Body mass index (BMI) 30.0-30.9, adult: Secondary | ICD-10-CM

## 2024-08-29 DIAGNOSIS — Z6832 Body mass index (BMI) 32.0-32.9, adult: Secondary | ICD-10-CM | POA: Insufficient documentation

## 2024-08-29 MED ORDER — VITAMIN D (ERGOCALCIFEROL) 1.25 MG (50000 UNIT) PO CAPS: 50000.0000 [IU] | ORAL_CAPSULE | ORAL | 0 refills | Status: DC

## 2024-08-29 NOTE — Progress Notes (Signed)
 Office: [phone]  /  Fax: [phone]  WEIGHT SUMMARY AND BIOMETRICS  Anthropometric Measurements Height: 5' 1 (1.549 m) Weight: 163 lb (73.9 kg) BMI (Calculated): 30.81 Weight at Last Visit: 170 lb Weight Lost Since Last Visit: 7 lb Weight Gained Since Last Visit: 0 Starting Weight: 170 lb Total Weight Loss (lbs): 7 lb (3.175 kg) Peak Weight: 173 lb   Body Composition  Body Fat %: 31.3 % Fat Mass (lbs): 51 lbs Muscle Mass (lbs): 106.6 lbs Total Body Water (lbs): 67 lbs Visceral Fat Rating : 7   Other Clinical Data RMR: 1382 Fasting: no Labs: no Today's Visit #: 2 Starting Date: 08/15/24    Chief Complaint: OBESITY    History of Present Illness Candace Frank is a 53 year old female with obesity who presents for a follow-up on her obesity workup and eating plan.  She has been following the category two eating plan 80% of the time and has lost seven pounds in the last two weeks. She has not yet started exercising. Initially, the eating plan was challenging, but she found it easier by incorporating frozen dinners and sandwiches. She does not feel hungry and sometimes finds it difficult to finish meals, particularly those involving eggs, which she is not fond of.  In addition to obesity, she is being treated for type 2 diabetes. Her hemoglobin A1c has improved from 6.8 to 6.5. Her fasting glucose was 94, and her insulin level was 28, which is elevated. She is not currently on insulin therapy.  She is also being treated for hypertension with lisinopril hydrochlorothiazide 20/25 mg. Her blood pressure was 101/70. She wants to reduce her medication needs.  For hyperlipidemia, she is on lovastatin 40 mg. She has not taken her cholesterol medication for about a month prior to the test, which may have affected her cholesterol levels. ApoB is also high.  Her recent lab tests showed normal electrolytes, slightly elevated creatinine with a GFR of 64, and a slightly  elevated ALT. Her vitamin D level is very low at 13, and she has not been taking any supplements. B12 levels are low. Red and white blood cell counts are normal, and thyroid function is on target. Iron  studies are normal.      PHYSICAL EXAM:  Blood pressure 101/70, pulse 86, temperature 98 F (36.7 C), height 5' 1 (1.549 m), weight 163 lb (73.9 kg), SpO2 95%. Body mass index is 30.8 kg/m.  DIAGNOSTIC DATA REVIEWED:  BMET    Component Value Date/Time   NA 139 08/15/2024 0921   K 4.5 08/15/2024 0921   CL 102 08/15/2024 0921   CO2 23 08/15/2024 0921   GLUCOSE 94 08/15/2024 0921   GLUCOSE 103 (H) 08/10/2016 0822   BUN 15 08/15/2024 0921   CREATININE 1.05 (H) 08/15/2024 0921   CALCIUM 9.5 08/15/2024 0921   GFRNONAA >60 08/10/2016 0822   GFRAA >60 08/10/2016 0822   Lab Results  Component Value Date   HGBA1C 6.5 (H) 08/15/2024   Lab Results  Component Value Date   INSULIN 28.0 (H) 08/15/2024   Lab Results  Component Value Date   TSH 0.969 08/15/2024   CBC    Component Value Date/Time   WBC 3.5 08/15/2024 0921   WBC 3.9 09/28/2016 1101   RBC 4.27 08/15/2024 0921   RBC 4.19 09/28/2016 1101   HGB 12.9 08/15/2024 0921   HCT 39.8 08/15/2024 0921   PLT 276 08/15/2024 0921   MCV 93 08/15/2024 0921   MCH  30.2 08/15/2024 0921   MCH 28.0 09/28/2016 1101   MCHC 32.4 08/15/2024 0921   MCHC 33.1 09/28/2016 1101   RDW 14.0 08/15/2024 0921   Iron  Studies    Component Value Date/Time   IRON  90 08/15/2024 0921   TIBC 295 08/15/2024 0921   FERRITIN 71 08/15/2024 0921   IRONPCTSAT 31 08/15/2024 0921   Lipid Panel  No results found for: CHOL, TRIG, HDL, CHOLHDL, VLDL, LDLCALC, LDLDIRECT Hepatic Function Panel     Component Value Date/Time   PROT 7.0 08/15/2024 0921   ALBUMIN 4.5 08/15/2024 0921   AST 30 08/15/2024 0921   ALT 41 (H) 08/15/2024 0921   ALKPHOS 67 08/15/2024 0921   BILITOT 0.4 08/15/2024 0921      Component Value Date/Time   TSH 0.969  08/15/2024 0921   Nutritional Lab Results  Component Value Date   VD25OH 13.6 (L) 08/15/2024     Assessment and Plan Assessment & Plan Obesity She has lost 7 pounds in the last two weeks, with 4.5 pounds being fat and 2.5 pounds being water, indicating a favorable fat-to-water loss ratio. Visceral fat has decreased, which is beneficial as it is the dangerous fat around organs. The current rate of fat loss is slightly over 2 pounds per week, which may trigger a metabolic slowdown if not adjusted. - Increase snack calories from 200 to 300 to prevent metabolic slowdown. - Continue current eating plan with adjustments as needed for sustainability.  Type 2 diabetes mellitus Hemoglobin A1c has improved from 6.8 to 6.5, indicating better glycemic control. Fasting glucose is 94. Elevated fasting insulin at 28 suggests the pancreas is overworking to maintain normal glucose levels, which could lead to pancreatic burnout and increased fat storage. The current eating plan is designed to address insulin levels and improve glycemic control. - Continue current eating plan to manage insulin levels and improve glycemic control. - Monitor insulin levels and consider medication if genetic factors prevent improvement.  Essential hypertension Blood pressure is well controlled at 101/70 on lisinopril-hydrochlorothiazide 20/25 mg. She expresses a desire to reduce medication dependency. - Continue lisinopril-hydrochlorothiazide 20/25 mg. - Monitor blood pressure and adjust treatment as needed.  Mixed hyperlipidemia She was not taking lovastatin for a month prior to the test, resulting in elevated cholesterol levels. LDL needs to be below 70, and ApoB should be below 90. HDL is below the desired level for women, which can be improved with exercise. Triglycerides are elevated, indicating carbohydrate intolerance. - Resume lovastatin 40 mg. - Encourage exercise to improve HDL levels. - Monitor lipid levels and  adjust treatment as needed.  Vitamin D deficiency Vitamin D level is extremely low at 13, putting her at high risk for osteoporosis and causing fatigue. The deficiency is likely due to inadequate sun exposure and possibly genetic factors affecting vitamin D synthesis. - Prescribe high-dose vitamin D supplementation once a week for three months. - Send prescription to [facility] on [address].  Elevated liver enzymes (ALT) ALT is slightly elevated, commonly seen in individuals working on weight loss and often associated with fatty liver. The best treatment is continued weight loss and dietary management. - Continue current weight loss and dietary management plan. - Re-evaluate liver enzymes in three months.  Elevated creatinine Creatinine is slightly elevated, possibly due to medication. GFR is 64, which is above the threshold of concern. Monitoring is necessary to ensure this is not a trend. - Monitor creatinine and GFR levels to ensure stability. - Continue to hydrate - Continue  with weight loss efforts   Follow-Up Follow-up appointments are necessary to monitor progress and adjust treatment plans as needed. - Schedule follow-up appointments with the provider or nurse practitioner, Elouise, if the provider is unavailable.     I personally spent a total of 52 minutes in the care of the patient today including preparing to see the patient, reviewing separately obtained history, performing a medically appropriate evaluation of current problems, placing orders in the EMR, documenting clinical information in the EMR, customized nutritional counseling for their specific health and social needs, independently interpreting results, discussing results with the patient and educating them on how these results can affect their health and weight, and explaining the pathophysiology of obesity and how it is significantly more complex than eat less and exercise more.  Adhya was counseled on the importance  of maintaining healthy lifestyle habits, including balanced nutrition, regular physical activity, and behavioral modifications, while taking antiobesity medication.  Patient verbalized understanding that medication is an adjunct to, not a replacement for, lifestyle changes and that the long-term success and weight maintenance depend on continued adherence to these strategies.   Landra was informed of the importance of frequent follow up visits to maximize her success with intensive lifestyle modifications for her obesity and obesity related health conditions as recommended by USPSTF and CMS guidelines   Louann Penton, MD

## 2024-09-11 ENCOUNTER — Ambulatory Visit (INDEPENDENT_AMBULATORY_CARE_PROVIDER_SITE_OTHER): Admitting: Physician Assistant

## 2024-10-07 NOTE — Progress Notes (Unsigned)
 SUBJECTIVE: Discussed the use of AI scribe software for clinical note transcription with the patient, who gave verbal consent to proceed.  Chief Complaint: Obesity  Interim History: She is up 1 lb since her last visit.  Down 6 lbs overall   Candace Frank is here to discuss her progress with her obesity treatment plan. She is on the Category 2 Plan and states she is following her eating plan approximately 10 % of the time. She states she is not exercising .  Candace Frank is a 53 year old female with obesity who presents for follow-up of her obesity treatment plan.  She has experienced a setback in her obesity management plan since the Thanksgiving holiday, as she hosted the event and had leftover food at home. She is working to regain her progress before the upcoming holidays by maintaining her weight and increasing her exercise. She plans to include her daughter in her gym sessions for mutual support. Her husband is also working with [facility] to lose weight.   Her current medications include lisinopril and hydrochlorothiazide for blood pressure control, which she states is effective. Her body adipose percentage is 32.7%, and her visceral adipose rating is 8.  She works in an office setting, primarily sitting at a computer, and does not have a standing desk. She is considering using a peddler under her desk to increase activity. She enjoys walking, even in cold weather, and finds it mentally de-stressing. She has used [employer] for walking exercises in the past and finds them helpful and we discussed resumption during colder months.  Her vitamin D  was previously low, and she has started a course of supplementation. She also has low B12 levels and we discussed adding a B 12 supplement today.   She is trying to eat whole foods, maintain protein intake, and drink water regularly. However, she does not sleep well due to working at home and other activities, though she does not feel overly tired.  She is planning to meal prep with her husband, who is also following a similar plan, to help stay on track.   OBJECTIVE: Visit Diagnoses: Problem List Items Addressed This Visit     Vitamin D  deficiency   Relevant Medications   Vitamin D , Ergocalciferol , (DRISDOL ) 1.25 MG (50000 UNIT) CAPS capsule   Diabetes mellitus (HCC) - Primary   Obesity starting BMI 32.12   Other Visit Diagnoses       Renal insufficiency         Hypertension associated with diabetes (HCC)         BMI 31.0-31.9,adult Current BMI 31.0         Obesity Management is ongoing with a focus on lifestyle modifications. Recent holiday indulgence led to a temporary lapse in dietary and exercise routines. Current adipose percentage is 32.7%. which is improved. Visceral adipose rating is 8, within the desired range of 10 or less. Muscle mass has decreased, indicating a need for increased physical activity. - Increase physical activity, including walking and using a peddler under the desk. - Incorporate YouTube walking videos for additional exercise. - Focus on meal planning and preparation, including using a broaster for meal prepping. - Consider using [employer] for quick, healthy meals. - Explore breakfast options such as [employer] waffles with sugar-free syrup and protein shakes. - Encourage family involvement in exercise and meal planning.   Type 2 Diabetes Mellitus with hyperglycemia, without long-term current use of insulin  HgbA1c is not at goal. Last A1c was 6.6- at  [facility] on 09/24/24  Medication(s): None She has been working on pharmacist, hospital . A1c remains slightly above goal.  Lab Results  Component Value Date   HGBA1C 6.5 (H) 08/15/2024   Lab Results  Component Value Date   CREATININE 1.05 (H) 08/15/2024   No results found for: GFR  Plan:  She is working  on nutrition plan to decrease simple carbohydrates, increase lean proteins and exercise to promote weight loss and improve glycemic  control . May want to discuss medications at future visits, especially as has developed some mild renal insufficiency which is a new finding with her PCP at [facility] last month.  New renal insufficiency Lab results and trends reviewed.  Patient has diabetes and hypertension. Last GFR was 54- previous in August 2025 was 60. No results found for: GFR Lab Results  Component Value Date   CREATININE 1.05 (H) 08/15/2024   BUN 15 08/15/2024   NA 139 08/15/2024   K 4.5 08/15/2024   CL 102 08/15/2024   CO2 23 08/15/2024    Plan: We discussed several lifestyle modifications today and she will continue to work on diet, exercise and weight loss efforts.  Increase water intake. Decrease intake of soda. Patient will avoid nephrotoxic medications such as NSAIDs.  Maximize control of diabetes and hypertension.    Vitamin D  deficiency Vitamin D  levels are low, requiring supplementation to improve energy levels and overall well-being. On Ergocalciferol  50,000 units once weekly.  Low vitamin D  levels can be associated with adiposity and may result in leptin resistance and weight gain. Also associated with fatigue.  Currently on vitamin D  supplementation without any adverse effects such as nausea, vomiting or muscle weakness.   - Refilled/continue Ergocalciferol  50,000 units once weekly  Meds ordered this encounter  Medications   Vitamin D , Ergocalciferol , (DRISDOL ) 1.25 MG (50000 UNIT) CAPS capsule    Sig: Take 1 capsule (50,000 Units total) by mouth every 7 (seven) days.    Dispense:  4 capsule    Refill:  0    Hypertension Well-controlled with current medication regimen of lisinopril and hydrochlorothiazide. Blood pressure readings are within normal range. - Continue current antihypertensive medications: lisinopril and hydrochlorothiazide. BP Readings from Last 3 Encounters:  10/08/24 117/74  08/29/24 101/70  08/15/24 137/88   Lab Results  Component Value Date   NA 139 08/15/2024    CL 102 08/15/2024   K 4.5 08/15/2024   CO2 23 08/15/2024   BUN 15 08/15/2024   CREATININE 1.05 (H) 08/15/2024   EGFR 64 08/15/2024   CALCIUM 9.5 08/15/2024   PHOS 2.9 (L) 08/15/2024   ALBUMIN 4.5 08/15/2024   GLUCOSE 94 08/15/2024   Continue to work on nutrition plan to promote weight loss and improve BP control.  Continue Lisinopril-hydrochlorothiazide 20-25 mg daily  Vitals Temp: 98.3 F (36.8 C) BP: 117/74 Pulse Rate: 68 SpO2: 96 %   Anthropometric Measurements Height: 5' 1 (1.549 m) Weight: 164 lb (74.4 kg) BMI (Calculated): 31 Weight at Last Visit: 163 lb Weight Lost Since Last Visit: 0 Weight Gained Since Last Visit: 1 lb Starting Weight: 170 lb Total Weight Loss (lbs): 6 lb (2.722 kg) Peak Weight: 173 lb   Body Composition  Body Fat %: 32.7 % Fat Mass (lbs): 53.6 lbs Muscle Mass (lbs): 105 lbs Total Body Water (lbs): 67.2 lbs Visceral Fat Rating : 8   Other Clinical Data Fasting: No Labs: No Today's Visit #: 3 Starting Date: 08/15/24     ASSESSMENT AND PLAN:  Diet:  Jerilynn is currently in the action stage of change. As such, her goal is to get back to weight loss efforts. She has agreed to Category 2 Plan.  Exercise: Darleny has been instructed to work up to a goal of 150 minutes of combined cardio and strengthening exercise per week, to try a geriatric exercise plan, and that some exercise is better than none for weight loss and overall health benefits.   Behavior Modification:  We discussed the following Behavioral Modification Strategies today: increasing lean protein intake, decreasing simple carbohydrates, increasing vegetables, increase H2O intake, increase high fiber foods, meal planning and cooking strategies, holiday eating strategies, avoiding temptations, and planning for success. We discussed various medication options to help [facility] with her weight loss efforts and we both agreed to continue to work on nutritional and behavioral strategies to  promote weight loss.  .  Return in about 5 weeks (around 11/12/2024).SABRA She was informed of the importance of frequent follow up visits to maximize her success with intensive lifestyle modifications for her multiple health conditions.  Attestation Statements:   Reviewed by clinician on day of visit: allergies, medications, problem list, medical history, surgical history, family history, social history, and previous encounter notes.   Time spent on visit including pre-visit chart review and post-visit care and charting was 37 minutes.    Finesse Fielder, PA-C

## 2024-10-08 ENCOUNTER — Ambulatory Visit (INDEPENDENT_AMBULATORY_CARE_PROVIDER_SITE_OTHER): Admitting: Physician Assistant

## 2024-10-08 ENCOUNTER — Encounter (INDEPENDENT_AMBULATORY_CARE_PROVIDER_SITE_OTHER): Payer: Self-pay | Admitting: Physician Assistant

## 2024-10-08 VITALS — BP 117/74 | HR 68 | Temp 98.3°F | Ht 61.0 in | Wt 164.0 lb

## 2024-10-08 DIAGNOSIS — Z6831 Body mass index (BMI) 31.0-31.9, adult: Secondary | ICD-10-CM

## 2024-10-08 DIAGNOSIS — E669 Obesity, unspecified: Secondary | ICD-10-CM

## 2024-10-08 DIAGNOSIS — E1159 Type 2 diabetes mellitus with other circulatory complications: Secondary | ICD-10-CM

## 2024-10-08 DIAGNOSIS — N289 Disorder of kidney and ureter, unspecified: Secondary | ICD-10-CM

## 2024-10-08 DIAGNOSIS — E1169 Type 2 diabetes mellitus with other specified complication: Secondary | ICD-10-CM

## 2024-10-08 DIAGNOSIS — E559 Vitamin D deficiency, unspecified: Secondary | ICD-10-CM

## 2024-10-08 MED ORDER — VITAMIN D (ERGOCALCIFEROL) 1.25 MG (50000 UNIT) PO CAPS: 50000.0000 [IU] | ORAL_CAPSULE | ORAL | 0 refills | Status: DC

## 2024-11-11 ENCOUNTER — Ambulatory Visit (INDEPENDENT_AMBULATORY_CARE_PROVIDER_SITE_OTHER): Admitting: Physician Assistant

## 2024-11-11 ENCOUNTER — Encounter (INDEPENDENT_AMBULATORY_CARE_PROVIDER_SITE_OTHER): Payer: Self-pay | Admitting: Physician Assistant

## 2024-11-11 VITALS — BP 128/84 | HR 62 | Temp 98.1°F | Ht 61.0 in | Wt 165.0 lb

## 2024-11-11 DIAGNOSIS — E669 Obesity, unspecified: Secondary | ICD-10-CM | POA: Diagnosis not present

## 2024-11-11 DIAGNOSIS — Z6831 Body mass index (BMI) 31.0-31.9, adult: Secondary | ICD-10-CM

## 2024-11-11 DIAGNOSIS — N289 Disorder of kidney and ureter, unspecified: Secondary | ICD-10-CM | POA: Diagnosis not present

## 2024-11-11 DIAGNOSIS — E559 Vitamin D deficiency, unspecified: Secondary | ICD-10-CM

## 2024-11-11 DIAGNOSIS — Z7985 Long-term (current) use of injectable non-insulin antidiabetic drugs: Secondary | ICD-10-CM

## 2024-11-11 DIAGNOSIS — E785 Hyperlipidemia, unspecified: Secondary | ICD-10-CM | POA: Diagnosis not present

## 2024-11-11 DIAGNOSIS — E1169 Type 2 diabetes mellitus with other specified complication: Secondary | ICD-10-CM | POA: Diagnosis not present

## 2024-11-11 MED ORDER — TIRZEPATIDE 2.5 MG/0.5ML ~~LOC~~ SOAJ: 2.5000 mg | SUBCUTANEOUS | 0 refills | Status: AC

## 2024-11-11 MED ORDER — VITAMIN D (ERGOCALCIFEROL) 1.25 MG (50000 UNIT) PO CAPS: 50000.0000 [IU] | ORAL_CAPSULE | ORAL | 0 refills | Status: AC

## 2024-11-11 NOTE — Progress Notes (Signed)
 "  SUBJECTIVE: Discussed the use of AI scribe software for clinical note transcription with the patient, who gave verbal consent to proceed.  Chief Complaint: Obesity  Interim History: She is up 1 lb since her last visit.  Candace Frank is here to discuss her progress with her obesity treatment plan. She is on the Category 2 Plan and states she is following her eating plan approximately 10 % of the time. She states she is exercising cardio/treadmill 30 minutes 3 times per week.  Candace Frank is a 54 year old female who presents for follow-up of her obesity treatment plan.  She has experienced a weight gain of one pound since her last visit, despite adhering to her category two plan only about 10% of the time. She consumes the recommended amount of protein, drinks enough water, and does not skip meals. She sleeps at least seven hours nightly and engages in cardio by walking on the treadmill for thirty minutes three days per week. She reports an overall weight loss of approximately five pounds since her last visit.  Her past medical history includes type two diabetes, hypertension, renal insufficiency, vitamin D  deficiency, hyperlipidemia, and elevated liver function studies. She is currently taking lisinopril hydrochlorothiazide 20-25 mg once daily, Mevacor 40 mg daily for hyperlipidemia, and ergocalciferol  50,000 units once weekly for vitamin D  deficiency.  Her A1c was 6.6 at [facility] in November, which has since improved., but still not at A1C goal of <6. Her kidney function was recently checked and she reports being told to avoid ibuprofen due to abnormal results/ continued signs of CKD. She reports her father and some of his siblings have kidney disease and there are multiple members of her fathers family with T2DM.  She is interested in starting medication to address her T2DM and also address concerns over recent worsening of kidney function as well as to promote weight loss.  She has no history  of pancreatitis, medullary thyroid cancer, or multiple endocrine neoplasia type two. She is postmenopausal and uses a Mirena IUD for birth control, which is due for replacement in February. She plans to continue using Mirena for another four to five years.  Her family history is significant for her father having kidney disease, and diabetes runs in his family. OBJECTIVE: Visit Diagnoses: Problem List Items Addressed This Visit     Vitamin D  deficiency   Relevant Medications   Vitamin D , Ergocalciferol , (DRISDOL ) 1.25 MG (50000 UNIT) CAPS capsule   Diabetes mellitus (HCC) - Primary   Relevant Medications   tirzepatide  (MOUNJARO ) 2.5 MG/0.5ML Pen   Obesity starting BMI 32.12   Relevant Medications   tirzepatide  (MOUNJARO ) 2.5 MG/0.5ML Pen   Other Visit Diagnoses       Hyperlipidemia associated with type 2 diabetes mellitus (HCC)       Relevant Medications   tirzepatide  (MOUNJARO ) 2.5 MG/0.5ML Pen     Renal insufficiency          Obesity Management is ongoing with a slight increase in adipose tissue since the last visit. She is following a category two plan inconsistently, approximately 10-20% of the time. She is working to get back on track with her nutrition plan.  She is engaging in cardio exercise, walking on the treadmill for 30 minutes three days per week.  Overall weight loss is approximately five pounds since starting with [facility].  Discussed potential use of Mounjaro  for weight loss and its benefits, including improved kidney function and A1c management.  We have reviewed the risks  and benefits of using a GLP-1. The patient denies a personal or family history of medullary thyroid cancer or MENII. The patient denies a history of pancreatitis. The potential risks and benefits of this GLP-1 were reviewed with the patient, and alternative treatment options were discussed. All questions were answered, and the patient wishes to move forward with this medication.  Discussed potential side  effects including nausea, constipation, and rare gallbladder issues. Emphasized the importance of continuing dietary and exercise efforts alongside medication. - Prescribed Mounjaro  for primary indication of T2DM in addition to weight loss . - Continue current exercise regimen - Monitor for side effects such as nausea and constipation - Ensure adequate hydration to minimize side effects  Type 2 diabetes mellitus A1c was 6.6% in November, with slight recent improvement noted. Discussed the potential benefits of Mounjaro  for A1c management and weight loss. No history of pancreatitis, medullary thyroid cancer, or multiple endocrine neoplasia type 2. Discussed the potential for improved fertility with weight loss and the importance of continued birth control use. Lab Results  Component Value Date   HGBA1C 6.5 (H) 08/15/2024   Lab Results  Component Value Date   CREATININE 1.05 (H) 08/15/2024   She is working  on nutrition plan to decrease simple carbohydrates, increase lean proteins and exercise to promote weight loss and improve glycemic control . - Prescribed Mounjaro  for A1c management - Monitor A1c levels- Due for follow up labs with PCP March 2026 - Ensure continued use of Mirena for birth control Meds ordered this encounter  Medications   tirzepatide  (MOUNJARO ) 2.5 MG/0.5ML Pen    Sig: Inject 2.5 mg into the skin once a week.    Dispense:  2 mL    Refill:  0   Vitamin D , Ergocalciferol , (DRISDOL ) 1.25 MG (50000 UNIT) CAPS capsule    Sig: Take 1 capsule (50,000 Units total) by mouth every 7 (seven) days.    Dispense:  4 capsule    Refill:  0    Renal insufficiency Recent kidney function tests showed abnormal results, potentially exacerbated by NSAID use. Discussed the potential benefits of Mounjaro  for improving kidney function. Advised to avoid NSAIDs to prevent further kidney damage. Last GFR 54 1 month ago at PCP- Declined since last renal function here in October.  May have  been related to NSAID use, but also strong family history of kidney disease in father's family.  Lab Results  Component Value Date   NA 139 08/15/2024   CL 102 08/15/2024   K 4.5 08/15/2024   CO2 23 08/15/2024   BUN 15 08/15/2024   CREATININE 1.05 (H) 08/15/2024   EGFR 64 08/15/2024   CALCIUM 9.5 08/15/2024   PHOS 2.9 (L) 08/15/2024   ALBUMIN 4.5 08/15/2024   GLUCOSE 94 08/15/2024  Plan: - Avoid NSAIDs - Monitor kidney function- Due for follow up labs in March 2026 with PCP.  -  Mounjaro  may be helpful for potential kidney function improvement  Hyperlipidemia LDL cholesterol is within normal range but not at goal for someone with type 2 diabetes. Discussed the potential benefits of Mounjaro  for improving lipid profile and reducing cardiovascular risk. Lipid Panel w/calc LDL at [facility] in [facility] Component 09/24/24 06/14/24 03/08/24 08/17/23 02/01/23 11/03/22  Cholesterol, Total 160 148 223 High  143 165 199  Triglyceride 124 93 159 106 177 145  HDL (High Density Lipoprotein) Cholesterol 43.5 40.9 45.8 43.0 42.2 48.4  LDL Calculated 92 89 145 High  79 87 122  VLDL Cholesterol 25  19 32 21 35 29  Cholesterol/HDL Ratio 3.7 3.6 4.9      Lipids improved overall since started on Mevacor- Lovastatin 40 mg daily.  Continue to work on nutrition plan -decreasing simple carbohydrates, increasing lean proteins, decreasing saturated fats and cholesterol , avoiding trans fats and exercise as able to promote weight loss, improve lipids and decrease cardiovascular risks.  Mounjaro  should also help to decrease CV risks.    Vitamin D  deficiency Currently managed with ergocalciferol  50,000 units once weekly.Reports no N/V or muscle weakness with Ergocalciferol  .  Last vitamin D  Lab Results  Component Value Date   VD25OH 13.6 (L) 08/15/2024   Low vitamin D  levels can be associated with adiposity and may result in leptin resistance and weight gain. Also associated with fatigue.   Currently on vitamin D  supplementation without any adverse effects such as nausea, vomiting or muscle weakness.  - Continue/refill ergocalciferol  50,000 units once weekly  Vitals Temp: 98.1 F (36.7 C) BP: 128/84 Pulse Rate: 62 SpO2: 98 %   Anthropometric Measurements Height: 5' 1 (1.549 m) Weight: 165 lb (74.8 kg) BMI (Calculated): 31.19 Weight at Last Visit: 164 lb Weight Lost Since Last Visit: 0 Weight Gained Since Last Visit: 1 lb Starting Weight: 170 lb Total Weight Loss (lbs): 5 lb (2.268 kg) Peak Weight: 173 lb   Body Composition  Body Fat %: 33.8 % Fat Mass (lbs): 56 lbs Muscle Mass (lbs): 104 lbs Total Body Water (lbs): 71.8 lbs Visceral Fat Rating : 8   Other Clinical Data Fasting: No Labs: No Today's Visit #: 4 Starting Date: 08/15/24     ASSESSMENT AND PLAN:  Diet: Rhea is currently in the action stage of change. As such, her goal is to get back to weight loss efforts. She has agreed to Category 2 Plan.  Exercise: Angelica has been instructed to work up to a goal of 150 minutes of combined cardio and strengthening exercise per week for weight loss and overall health benefits.   Behavior Modification:  We discussed the following Behavioral Modification Strategies today: increasing lean protein intake, decreasing simple carbohydrates, increasing vegetables, increase H2O intake, increase high fiber foods, meal planning and cooking strategies, better snacking choices, avoiding temptations, and planning for success. We discussed various medication options to help [facility] with her weight loss efforts and we both agreed to start Mounjaro  for Type 2 diabetes management, potential improvement in hyperlipidemia and renal function as well to promote medical weight loss.  Return in about 4 weeks (around 12/09/2024).SABRA She was informed of the importance of frequent follow up visits to maximize her success with intensive lifestyle modifications for her multiple health  conditions.  Attestation Statements:   Reviewed by clinician on day of visit: allergies, medications, problem list, medical history, surgical history, family history, social history, and previous encounter notes.   Time spent on visit including pre-visit chart review and post-visit care and charting was 47 minutes.    Jodell Weitman, PA-C  "

## 2024-11-12 ENCOUNTER — Telehealth (INDEPENDENT_AMBULATORY_CARE_PROVIDER_SITE_OTHER): Payer: Self-pay | Admitting: *Deleted

## 2024-11-12 NOTE — Telephone Encounter (Signed)
 Message from Plan Request Reference Number: [id number].  MOUNJARO  INJ 2.5/0.5 is approved through 11/12/2025.  Your patient may now fill this prescription and it will be covered.. Authorization Expiration Date: November 12, 2025.

## 2024-11-12 NOTE — Telephone Encounter (Signed)
 Candace Frank (Key: [id number])  Your information has been sent to [facility].

## 2024-11-13 ENCOUNTER — Telehealth (INDEPENDENT_AMBULATORY_CARE_PROVIDER_SITE_OTHER): Payer: Self-pay | Admitting: Physician Assistant

## 2024-11-13 NOTE — Telephone Encounter (Signed)
 Pt called and was prescribed mounjaro  and insurance is not covering it at all. Its over 1000 a month. Any other options?

## 2024-12-10 ENCOUNTER — Ambulatory Visit (INDEPENDENT_AMBULATORY_CARE_PROVIDER_SITE_OTHER): Admitting: Physician Assistant
